# Patient Record
Sex: Female | Born: 1970 | Race: White | Hispanic: No | Marital: Married | Smoking: Never smoker
Health system: Southern US, Community
[De-identification: ages and names within clinical notes are randomized; demographics above are authoritative.]

## PROBLEM LIST (undated history)

## (undated) DIAGNOSIS — N200 Calculus of kidney: Secondary | ICD-10-CM

## (undated) DIAGNOSIS — I10 Essential (primary) hypertension: Secondary | ICD-10-CM

## (undated) HISTORY — PX: KNEE ARTHROSCOPY: SUR90

## (undated) HISTORY — PX: GALLBLADDER SURGERY: SHX652

## (undated) HISTORY — PX: ABDOMINAL HYSTERECTOMY: SHX81

---

## 2000-03-11 ENCOUNTER — Emergency Department (HOSPITAL_COMMUNITY): Admission: EM | Admit: 2000-03-11 | Discharge: 2000-03-11 | Payer: Self-pay | Admitting: Emergency Medicine

## 2000-03-11 ENCOUNTER — Encounter: Payer: Self-pay | Admitting: Emergency Medicine

## 2004-04-02 ENCOUNTER — Emergency Department: Payer: Self-pay | Admitting: Emergency Medicine

## 2007-01-11 ENCOUNTER — Observation Stay: Payer: Self-pay

## 2007-01-16 ENCOUNTER — Ambulatory Visit: Payer: Self-pay | Admitting: Obstetrics and Gynecology

## 2007-02-05 ENCOUNTER — Encounter: Payer: Self-pay | Admitting: Maternal & Fetal Medicine

## 2007-02-06 ENCOUNTER — Inpatient Hospital Stay: Payer: Self-pay | Admitting: Unknown Physician Specialty

## 2009-05-06 ENCOUNTER — Ambulatory Visit: Payer: Self-pay | Admitting: Family Medicine

## 2009-05-26 ENCOUNTER — Ambulatory Visit: Payer: Self-pay | Admitting: Family Medicine

## 2010-11-26 ENCOUNTER — Emergency Department: Payer: Self-pay

## 2011-01-15 ENCOUNTER — Emergency Department: Payer: Self-pay | Admitting: Emergency Medicine

## 2011-02-10 ENCOUNTER — Emergency Department: Payer: Self-pay | Admitting: Emergency Medicine

## 2012-01-19 ENCOUNTER — Emergency Department: Payer: Self-pay | Admitting: Emergency Medicine

## 2012-01-19 LAB — COMPREHENSIVE METABOLIC PANEL
Albumin: 4.1 g/dL (ref 3.4–5.0)
Alkaline Phosphatase: 88 U/L (ref 50–136)
BUN: 16 mg/dL (ref 7–18)
Bilirubin,Total: 0.3 mg/dL (ref 0.2–1.0)
Chloride: 108 mmol/L — ABNORMAL HIGH (ref 98–107)
Creatinine: 0.84 mg/dL (ref 0.60–1.30)
SGPT (ALT): 24 U/L (ref 12–78)
Total Protein: 8.1 g/dL (ref 6.4–8.2)

## 2012-01-19 LAB — URINALYSIS, COMPLETE
Bacteria: NONE SEEN
Glucose,UR: NEGATIVE mg/dL (ref 0–75)
Nitrite: NEGATIVE
Protein: 30

## 2012-01-19 LAB — CBC
HGB: 14.6 g/dL (ref 12.0–16.0)
MCH: 31.2 pg (ref 26.0–34.0)
RBC: 4.68 10*6/uL (ref 3.80–5.20)

## 2012-02-07 ENCOUNTER — Encounter (HOSPITAL_COMMUNITY): Payer: Self-pay | Admitting: Physical Medicine and Rehabilitation

## 2012-02-07 ENCOUNTER — Emergency Department (HOSPITAL_COMMUNITY)
Admission: EM | Admit: 2012-02-07 | Discharge: 2012-02-07 | Disposition: A | Discharge status: Home / Self Care | Payer: Self-pay | Attending: Emergency Medicine | Admitting: Emergency Medicine

## 2012-02-07 ENCOUNTER — Observation Stay: Payer: Self-pay | Admitting: Internal Medicine

## 2012-02-07 DIAGNOSIS — R109 Unspecified abdominal pain: Secondary | ICD-10-CM | POA: Insufficient documentation

## 2012-02-07 DIAGNOSIS — R112 Nausea with vomiting, unspecified: Secondary | ICD-10-CM | POA: Insufficient documentation

## 2012-02-07 LAB — URINALYSIS, COMPLETE
Bacteria: NONE SEEN
Blood: NEGATIVE
Glucose,UR: NEGATIVE mg/dL (ref 0–75)
Leukocyte Esterase: NEGATIVE
Nitrite: NEGATIVE
Protein: NEGATIVE
Specific Gravity: 1.023 (ref 1.003–1.030)

## 2012-02-07 LAB — URINALYSIS, ROUTINE W REFLEX MICROSCOPIC
Bilirubin Urine: NEGATIVE
Glucose, UA: NEGATIVE mg/dL
Hgb urine dipstick: NEGATIVE
Ketones, ur: NEGATIVE mg/dL
Leukocytes, UA: NEGATIVE
Nitrite: NEGATIVE
Protein, ur: NEGATIVE mg/dL
Specific Gravity, Urine: 1.034 — ABNORMAL HIGH (ref 1.005–1.030)
Urobilinogen, UA: 0.2 mg/dL (ref 0.0–1.0)
pH: 6 (ref 5.0–8.0)

## 2012-02-07 LAB — COMPREHENSIVE METABOLIC PANEL
Albumin: 4.1 g/dL (ref 3.4–5.0)
Anion Gap: 7 (ref 7–16)
Calcium, Total: 8.8 mg/dL (ref 8.5–10.1)
Chloride: 106 mmol/L (ref 98–107)
Co2: 24 mmol/L (ref 21–32)
EGFR (African American): >60
EGFR (Non-African Amer.): >60
Glucose: 90 mg/dL (ref 65–99)
Osmolality: 276 (ref 275–301)
Potassium: 4.5 mmol/L (ref 3.5–5.1)
SGOT(AST): 25 U/L (ref 15–37)
Sodium: 137 mmol/L (ref 136–145)

## 2012-02-07 LAB — CBC
HGB: 14.3 g/dL (ref 12.0–16.0)
MCV: 91 fL (ref 80–100)
Platelet: 174 10*3/uL (ref 150–440)
RBC: 4.55 10*6/uL (ref 3.80–5.20)
WBC: 8 10*3/uL (ref 3.6–11.0)

## 2012-02-07 LAB — LIPASE, BLOOD: Lipase: 142 U/L (ref 73–393)

## 2012-02-07 NOTE — ED Notes (Signed)
Pt presents to department for evaluation of R sided abdominal pain. Also states N/V. Onset today. 9/10 pain at the time. Denies urinary symptoms. She is conscious alert and oriented x4.

## 2012-02-08 LAB — CBC WITH DIFFERENTIAL/PLATELET
Basophil #: 0 10*3/uL (ref 0.0–0.1)
Eosinophil %: 0.6 %
HCT: 40 % (ref 35.0–47.0)
HGB: 13.8 g/dL (ref 12.0–16.0)
Lymphocyte %: 23.9 %
MCV: 91 fL (ref 80–100)
Monocyte %: 9.9 %
Neutrophil #: 4 10*3/uL (ref 1.4–6.5)
Platelet: 205 10*3/uL (ref 150–440)
RDW: 13.3 % (ref 11.5–14.5)

## 2012-02-08 LAB — COMPREHENSIVE METABOLIC PANEL
Anion Gap: 5 — ABNORMAL LOW (ref 7–16)
BUN: 10 mg/dL (ref 7–18)
Bilirubin,Total: 0.3 mg/dL (ref 0.2–1.0)
Chloride: 104 mmol/L (ref 98–107)
Co2: 28 mmol/L (ref 21–32)
EGFR (African American): >60
EGFR (Non-African Amer.): >60
Potassium: 4.3 mmol/L (ref 3.5–5.1)
SGOT(AST): 26 U/L (ref 15–37)
SGPT (ALT): 31 U/L (ref 12–78)

## 2012-02-08 LAB — LIPID PANEL
HDL Cholesterol: 45 mg/dL (ref 40–60)
Ldl Cholesterol, Calc: 92 mg/dL (ref 0–100)
Triglycerides: 83 mg/dL (ref 0–200)
VLDL Cholesterol, Calc: 17 mg/dL (ref 5–40)

## 2012-07-05 ENCOUNTER — Emergency Department: Payer: Self-pay | Admitting: Emergency Medicine

## 2012-07-05 LAB — COMPREHENSIVE METABOLIC PANEL
Albumin: 3.7 g/dL (ref 3.4–5.0)
Anion Gap: 3 — ABNORMAL LOW (ref 7–16)
BUN: 14 mg/dL (ref 7–18)
Calcium, Total: 8.4 mg/dL — ABNORMAL LOW (ref 8.5–10.1)
Creatinine: 0.87 mg/dL (ref 0.60–1.30)
Osmolality: 277 (ref 275–301)
Potassium: 3.7 mmol/L (ref 3.5–5.1)
Total Protein: 7.9 g/dL (ref 6.4–8.2)

## 2012-07-05 LAB — CBC
HCT: 38.5 % (ref 35.0–47.0)
MCHC: 32.7 g/dL (ref 32.0–36.0)
Platelet: 238 10*3/uL (ref 150–440)

## 2012-07-05 LAB — URINALYSIS, COMPLETE
Nitrite: NEGATIVE
Protein: NEGATIVE
Specific Gravity: 1.019 (ref 1.003–1.030)

## 2013-02-11 ENCOUNTER — Inpatient Hospital Stay: Payer: Self-pay | Admitting: Surgery

## 2013-02-11 LAB — URINALYSIS, COMPLETE
Bacteria: NONE SEEN
Glucose,UR: NEGATIVE mg/dL (ref 0–75)
Ketone: NEGATIVE
Leukocyte Esterase: NEGATIVE
Nitrite: NEGATIVE
Protein: NEGATIVE
Specific Gravity: 1.016 (ref 1.003–1.030)

## 2013-02-11 LAB — COMPREHENSIVE METABOLIC PANEL
Albumin: 3.9 g/dL (ref 3.4–5.0)
Alkaline Phosphatase: 88 U/L (ref 50–136)
Calcium, Total: 9.2 mg/dL (ref 8.5–10.1)
Chloride: 103 mmol/L (ref 98–107)
Creatinine: 0.91 mg/dL (ref 0.60–1.30)
Glucose: 88 mg/dL (ref 65–99)
Osmolality: 274 (ref 275–301)
SGPT (ALT): 25 U/L (ref 12–78)
Sodium: 137 mmol/L (ref 136–145)
Total Protein: 8.1 g/dL (ref 6.4–8.2)

## 2013-02-11 LAB — CBC
HCT: 41.4 % (ref 35.0–47.0)
MCH: 30 pg (ref 26.0–34.0)
RBC: 4.81 10*6/uL (ref 3.80–5.20)
RDW: 13.1 % (ref 11.5–14.5)

## 2013-02-11 LAB — LIPASE, BLOOD: Lipase: 205 U/L (ref 73–393)

## 2013-02-14 LAB — CREATININE, SERUM
Creatinine: 0.92 mg/dL (ref 0.60–1.30)
EGFR (African American): >60

## 2013-09-08 ENCOUNTER — Emergency Department: Payer: Self-pay | Admitting: Emergency Medicine

## 2013-09-08 LAB — CBC WITH DIFFERENTIAL/PLATELET
BASOS ABS: 0 10*3/uL (ref 0.0–0.1)
BASOS PCT: 0.2 %
Eosinophil #: 0.2 10*3/uL (ref 0.0–0.7)
Eosinophil %: 1.4 %
HCT: 39.9 % (ref 35.0–47.0)
HGB: 13.2 g/dL (ref 12.0–16.0)
Lymphocyte #: 1.9 10*3/uL (ref 1.0–3.6)
Lymphocyte %: 16.1 %
MCH: 29.6 pg (ref 26.0–34.0)
MCHC: 33.1 g/dL (ref 32.0–36.0)
MCV: 90 fL (ref 80–100)
MONOS PCT: 7 %
Monocyte #: 0.8 x10 3/mm (ref 0.2–0.9)
NEUTROS ABS: 8.7 10*3/uL — AB (ref 1.4–6.5)
Neutrophil %: 75.3 %
PLATELETS: 273 10*3/uL (ref 150–440)
RBC: 4.46 10*6/uL (ref 3.80–5.20)
RDW: 13.9 % (ref 11.5–14.5)
WBC: 11.5 10*3/uL — ABNORMAL HIGH (ref 3.6–11.0)

## 2013-09-08 LAB — COMPREHENSIVE METABOLIC PANEL
ALK PHOS: 75 U/L
ANION GAP: 5 — AB (ref 7–16)
Albumin: 3.5 g/dL (ref 3.4–5.0)
BILIRUBIN TOTAL: 0.1 mg/dL — AB (ref 0.2–1.0)
BUN: 19 mg/dL — ABNORMAL HIGH (ref 7–18)
CALCIUM: 8.6 mg/dL (ref 8.5–10.1)
CHLORIDE: 105 mmol/L (ref 98–107)
CO2: 25 mmol/L (ref 21–32)
Creatinine: 0.98 mg/dL (ref 0.60–1.30)
EGFR (African American): >60
EGFR (Non-African Amer.): >60
Glucose: 96 mg/dL (ref 65–99)
OSMOLALITY: 272 (ref 275–301)
Potassium: 4 mmol/L (ref 3.5–5.1)
SGOT(AST): 28 U/L (ref 15–37)
SGPT (ALT): 20 U/L (ref 12–78)
Sodium: 135 mmol/L — ABNORMAL LOW (ref 136–145)
Total Protein: 7.8 g/dL (ref 6.4–8.2)

## 2013-09-08 LAB — URINALYSIS, COMPLETE
BILIRUBIN, UR: NEGATIVE
GLUCOSE, UR: NEGATIVE mg/dL (ref 0–75)
Ketone: NEGATIVE
Nitrite: NEGATIVE
PH: 5 (ref 4.5–8.0)
SPECIFIC GRAVITY: 1.019 (ref 1.003–1.030)
WBC UR: 1851 /HPF (ref 0–5)

## 2013-09-10 LAB — URINE CULTURE

## 2014-08-19 NOTE — Discharge Summary (Signed)
PATIENT NAME:  Paula Mccormick, Paula Mccormick MR#:  [id number] DATE OF BIRTH:  07/02/1970  DATE OF ADMISSION:  02/07/2012 DATE OF DISCHARGE:  02/08/2012  PRESENTING COMPLAINT: Abdominal pain on and off for two weeks, acute on chronic. The patient has had chronic abdominal pain for more than one year.   DISCHARGE DIAGNOSES:  1. 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 The patient had [facility] and here at [facility] since January of 2011, and essentially no abnormality has been found.  2. Mild gastritis/gastroesophageal reflux disease.  3. Anxiety.  4. Methicillin-resistant Staphylococcus aureus infection, completed Bactrim course on 02/08/2012.    CONSULTATION:  1. GI consultation with Dr. Niel HummerIftikhar.  2. Surgery consultation was made, however, the patient left prior to being evaluated by Surgery.   FOLLOWUP:  1. Follow up with Dr. Lemar LivingsByrnett on October 21st at 2:00 p.m.  2. Follow up with Dr. Niel HummerIftikhar, GI, in 1 to 2 weeks.    PROCEDURES: Upper GI endoscopy showed mild gastritis. Normal esophagus, normal examined duodenum.  DISCHARGE MEDICATIONS:  1. Bentyl 10 mg t.i.d.  2. Percocet 5/325, 1 to 2 t.i.d. p.r.n.  3. Wellbutrin XL 300 mg extended-release, 1 tablet daily.  4. Omeprazole 40 mg extended-release, 1 tablet daily.   LABORATORY, DIAGNOSTIC AND RADIOLOGICAL DATA: Hepatobiliary scan negative without any obstruction or acute cholecystitis. Gallbladder ejection fraction is 83%. CBC within normal limits. Comprehensive metabolic panel within normal limits. Lipid profile within normal limits. Ultrasound of the abdomen: Normal gallbladder. Urine pregnancy test negative. Urinalysis negative for urinary tract infection. Lipase is 142.  BRIEF SUMMARY OF HOSPITAL COURSE: [name] is a 44 year old Caucasian female who has history of depression/anxiety, recent MRSA skin infection on Bactrim, completed the course  of Bactrim, came in with persistent abdominal pain and nausea for two or three weeks. The patient was evaluated in the ER two weeks ago. CT of the abdomen was negative except left inferior pole nephrolithiasis. Ultrasound also showed a normal gallbladder. She was admitted with:   1. Acute on chronic right upper quadrant pain with nausea: The patient underwent ultrasound of the abdomen and HIDA scan on 02/08/2012 which were essentially negative for gallbladder disease. The patient has had three other abdominal ultrasounds and three different CTs in the past since January 2011 here at [facility] along with two HIDA scans which have been negative for gallbladder disease. The patient believes this is her gallbladder pain and was requesting if she could be seen by a surgeon and get the gallbladder removed. She was explained that it is not her gallbladder at this time. However, she was very frustrated at did get at times emotional and tearful and wanted surgical consultation. I did curbside Dr. Excell Seltzerooper from Surgery. He was going to come to see the patient however, she requested she be seen by dr byrnett . The patient was seen by Dr. Niel HummerIftikhar, who performed an upper gastrointestinal endoscopy that showed mild gastritis. The patient is already taking PPI at home twice a day. Dr. Niel HummerIftikhar also recommends trial of Bentyl for possible visceral hypersensitivity syndrome/irritable bowel syndrome and follow-up in 2 to 3 weeks. Since the husband and patient were very upset and frustrated with the patient's ongoing abdominal pain for more than a year and a half, and not able to get any exact answers, I did involve Patient Relations. They were later on agreeable to go home and follow up with Dr. Lemar LivingsByrnett and Dr. Niel HummerIftikhar as outpatient.  2. Depression: Wellbutrin was continued.  3. Recent MRSA skin infection: On Bactrim, completed the course.   The hospital stay otherwise remained stable.   TIME SPENT: 40 minutes.   ____________________________ Wylie Hail Allena Katz, MD sap:cbb D: 02/09/2012 13:41:20 ET T: 02/10/2012 11:14:40 ET JOB#: [id number] cc: Lurline Del, MD Earline Mayotte, MD Willow Ora MD ELECTRONICALLY SIGNED 02/10/2012 21:00

## 2014-08-19 NOTE — Consult Note (Signed)
Chief Complaint:   Subjective/Chief Complaint EGD showed gastritis. Bx done. HIDA normal.  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  Will start Bentyl 10 mg TID. Elavil will be another option but due to potential interaction with Buspar will hold off for now. Patient to follow up with GI in 2 weeks for further recommendations. Continue PPI as OP. Will treat for H. pylori if positive as OP once the report is available. Full liquid diet, advance as tolerated. Will sign off. Please call us if needed. Discussed with Dr. Allena KatzPatel.   Electronic Signatures: Lurline DelIftikhar, Neno Hohensee (MD)  (Signed 09-Oct-13 12:40)  Authored: Chief Complaint   Last Updated: 09-Oct-13 12:40 by Lurline DelIftikhar, Teryl Gubler (MD)

## 2014-08-19 NOTE — Consult Note (Signed)
Brief Consult Note: Diagnosis: Chronic abdominal pain.   Patient was seen by consultant.   Comments: Chronic right sided abdominal pain, ? functional.  Recommendations: HIDA in am. If negative will proceed with an EGD. Will follow.  Electronic Signatures: Lurline DelIftikhar, Neely Kammerer (MD)  (Signed 08-Oct-13 17:58)  Authored: Brief Consult Note   Last Updated: 08-Oct-13 17:58 by Lurline DelIftikhar, Jariana Shumard (MD)

## 2014-08-19 NOTE — Consult Note (Signed)
PATIENT NAME:  Paula Mccormick, Paula Mccormick DATE OF BIRTH:  05-27-1970  DATE OF CONSULTATION:  02/08/2012  REFERRING PHYSICIAN:  Hospitalist service  CONSULTING PHYSICIAN:  Lurline DelShaukat Maneh Sieben, MD  REASON FOR CONSULTATION: Abdominal pain.   HISTORY OF PRESENT ILLNESS: The patient is a 44 year old female with history of depression. The patient was admitted with abdominal pain. According to the Emergency Room physician, the patient has been to the Emergency Room a couple of times with right upper quadrant abdominal pain. CT scan in the past as well as ultrasound had been fairly negative. She was last discharged on Percocet but came back to the Emergency Room again yesterday with pain mostly in the right upper quadrant area with some radiation to the back. Apparently this pain has been going on for more than a year and she has had [facility] with an EGD and a colonoscopy and some other tests about a year ago and, according to her, those tests were quite unremarkable. She has tried over-the-counter medicines as well as some prescription PPIs without significant improvement. She denies any heartburn and her pain is mainly aggravated by greasy food.   PAST MEDICAL HISTORY:  1. Depression. 2. History of MRSA. She is on Bactrim.   ALLERGIES: None.   SOCIAL HISTORY: She does not smoke or drink.   PAST SURGICAL HISTORY: None.   FAMILY HISTORY: Celine Ahrunt has lung cancer according to the chart.   MEDICATIONS AT HOME:  1. Wellbutrin. 2. Bactrim.   REVIEW OF SYSTEMS: Grossly negative except for what is mentioned in the history of present illness.    PHYSICAL EXAMINATION:   GENERAL: Well built female, somewhat obese, does not appear to be in any acute distress.   VITAL SIGNS: She is afebrile. Heart rate is in 70's and 80's, respirations about 12 to 14, blood pressure 135/78.   SKIN: Unremarkable. No jaundice was noted.   NECK: Neck veins are flat.   LUNGS: Grossly clear to  auscultation bilaterally with fair air entry. No added sounds.   CARDIOVASCULAR: Regular rate and rhythm. No gallops or murmur.   ABDOMEN: Quite soft and benign. Some tenderness was noted in the right upper quadrant area. There is no hepatosplenomegaly. No Murphy's sign. No rebound.   NEUROLOGIC: Unremarkable. She is fully awake, alert, and oriented.   LABORATORY, DIAGNOSTIC, AND RADIOLOGICAL DATA: White cell count normal at 8, hemoglobin 14.3, hematocrit 41.5, platelet count 174. Pregnancy test is negative. Electrolytes and liver enzymes normal. Serum lipase normal at 142.   Ultrasound of the abdomen normal. HIDA scan was requested, normal, with an ejection fraction of 83%.   ASSESSMENT AND PLAN: The patient is with right-sided abdominal pain with radiation to the back. This has been going on for more than a year and patient has had [facility] which was unremarkable. 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 An upper GI endoscopy was suggested as the HIDA scan is negative and upper GI endoscopy unremarkable except for mild gastritis. Her symptoms appear to be quite consistent with visceral hypersensitivity syndrome. I would recommend starting her on Bentyl 10 mg t.i.d. Advance diet. The patient can follow-up with GI as outpatient. Amitriptyline would be another option, although due to its potential interaction with BuSpar I will hold off on amitriptyline at this point. Further recommendations on outpatient follow-up.   Plan has been discussed with Dr. Allena KatzPatel. Will sign off. Please do not hesitate to  call us if we can be of any further assistance.   ____________________________ Lurline Del, MD si:drc D: 02/08/2012 12:45:36 ET T: 02/08/2012 12:57:13 ET JOB#: [id number]  cc: Lurline Del, MD, <Dictator> Lurline Del MD ELECTRONICALLY SIGNED 02/21/2012 17:22

## 2014-08-19 NOTE — H&P (Signed)
PATIENT NAME:  Paula HawthorneKERNODLE, Tahja L MR#:  [id number] DATE OF BIRTH:  17-Mar-1971  DATE OF ADMISSION:  02/07/2012  PRIMARY DOCTOR: Ulanda EdisonEmma Williams, MD   ER PHYSICIAN: Dr. Governor Rooksebecca Lord    CHIEF COMPLAINT: Abdominal pain.   HISTORY OF PRESENT ILLNESS: The patient is a 44 year old female patient with history of recent MRSA in the buttocks on Bactrim, also history of depression who came in because of abdominal pain. She has been having abdominal pain for the past 2 to 3 weeks and she also was seen in the ER on September 19th and discharged home for the same reason. The patient had abdominal pain in the right upper quadrant and midepigastric region radiating to the back around 8 to 10 out of 10 in severity. The patient has been having this pain for 2 to 3 weeks. She tried ibuprofen, Tylenol, Pepto-Bismol, and over-the-counter Emetrol for nausea. Because of persistent pain which is worsening and she is unable to keep anything down, she came to the ER today. The patient was noticed to have some fever this morning. The patient's pain is aggravated by eating and also eating greasy food. It is really not relieved with any ibuprofen or Tylenol. The patient denies any burning in the stomach. The patient has no chest pain. No cough but pain is worsened even by deep breath. No diarrhea.   PAST MEDICAL HISTORY:  1. Depression.  2. Recent history of MRSA, on Bactrim.   ALLERGIES: No known allergies.   SOCIAL HISTORY: No smoking. No drinking. No drugs. Is self-employed.   PAST SURGICAL HISTORY: None.   FAMILY HISTORY: The patient's aunt has lung cancer.    MEDICATIONS:  1. Wellbutrin 300 mg p.o. daily.  2. Bactrim 1 tablet p.o. b.i.d. There is one more day of Bactrim remaining for her recent MRSA in the buttock.   REVIEW OF SYSTEMS: CONSTITUTIONAL: Feels pain in the right upper quadrant and feels like fever this morning. EYES: No blurred vision. ENT: No tinnitus. No ear pain. No epistaxis. No difficulty  swallowing. RESPIRATORY: No cough. No wheezing. CARDIOVASCULAR: No chest pain. No orthopnea. GI: Has nausea, vomiting, abdominal pain. GU: No dysuria. ENDOCRINE: No polyuria. No nocturia. HEMATOLOGIC: No anemia or easy bruising. INTEGUMENTARY: No skin rashes. MUSCULOSKELETAL: No joint pain. NEUROLOGIC: No numbness or weakness. PSYCH: No anxiety or insomnia.   PHYSICAL EXAMINATION:   VITAL SIGNS: Temperature 97.7, pulse 77, respirations 24, blood pressure 137/86, sats 98% on room air.   GENERAL: Alert, awake, and oriented.    HEENT: Head atraumatic, normocephalic.   EYES: Pupils equally reacting to light. Extraocular movements intact.    ENT: No tympanic membrane congestion. No turbinate hypertrophy. No oropharyngeal erythema.   NECK: Normal range of motion. No JVD. No carotid bruits.   CARDIOVASCULAR: S1, S2 regular. No murmurs.   RESPIRATORY: Clear to auscultation. No wheeze. No rales.   CARDIOVASCULAR: S1, S2 regular. No murmurs.   ABDOMEN: The patient does have right upper quadrant tenderness present, also midepigastric tenderness present.   MUSCULOSKELETAL: Strength 5 out of 5 in upper and lower extremities.   SKIN: No skin rashes.   LYMPH: No cervical lymphadenopathy.   NEUROLOGIC: Oriented to time, place, person. Cranial nerves II through XII intact. Deep tendon reflexes 2+ bilaterally.   PSYCH: Oriented to time, place, and person.   LABORATORY, DIAGNOSTIC AND RADIOLOGIC DATA: Abdominal ultrasound showed pancreas is obstructed by bowel gas. Gallbladder is normal. No evidence of Murphy's sign. CBD is about 3 mm in diameter. Portal vein  is patent. Normal gallbladder.   Electrolytes sodium 137, potassium 4.5, chloride 106, bicarb 24, BUN 20, creatinine 0.82, glucose 90. LFTs within normal limits. WBC 8, hemoglobin 14.3, hematocrit 41.5, platelets 174. Lipase 142 over clear. Urinalysis showed normal clear urine. Leukocyte esterase negative.   EKG showed normal sinus, 75 beats  per minute.  Abdominal CAT scan on September 19th showed no acute findings in the abdomen or pelvis. Left-sided nephrolithiasis without hydronephrosis. There is a 2.6 cm cyst in the right ovary.    ASSESSMENT AND PLAN:  1. The patient is a 44 year old female with abdominal pain and nausea more after eating greasy foods. Symptoms are concerning for gallbladder disease. The patient cannot have HIDA scan because she got morphine. She got Dilaudid at 3:30 so HIDA scan is scheduled for tomorrow and she cannot have any narcotics for six hours. We are going to keep her on observation because she is unable to tolerate any p.o. pain medicines and needing IV pain medicine, IV fluids, and IV nausea medicine. Continue that. Continue IV pain medicine and nausea medicine. Continue clear liquids. Use Toradol for pain tomorrow. Get a HIDA scan and see if she has any gallbladder dyskinesia.   2. Depression. Continue Wellbutrin.  3. Recent history of MRSA. She is still on Bactrim so will order the Bactrim again.  4. Painful inspiration. Evaluated for pulmonary emboli by CT chest.  5. Recent history of CAT scan done of the abdomen which showed only left side nephrolithiasis but her pain is on the right side and her kidney function is normal so we will not repeat another CAT scan today of the abdomen.   Discussed the plan with the patient and the patient's husband in detail.   TIME SPENT: About 60 minutes on history and physical   ____________________________ Katha Hamming, MD sk:drc D: 02/07/2012 16:59:27 ET T: 02/07/2012 17:42:36 ET JOB#: [id number]  cc: Katha Hamming, MD, <Dictator> Lucillie Garfinkel. Mayford Knife, MD Katha Hamming MD ELECTRONICALLY SIGNED 03/01/2012 8:37

## 2014-08-22 NOTE — Consult Note (Signed)
Brief Consult Note: Diagnosis: RUQ pain.   Patient was seen by consultant.   Comments: Patient seen and evaluated. Episodic RUQ pain, with nasuea and vomiting, intermittently over the past 3 years. Worse over past 2 days. Last EGD was October 2013 with Dr Skeet Simmeriftikar, notable for gastritis. She was started on Bentyl for possible IBS. She takes bentyl currently TID, and this helps the nasuea and vomiting, but this has NOT helped the abdominal pain according to the patient. No significant changes to her bowel habits, they are chronically loose. No black or blood. No fever or chills. No heartburn, indigestion, or dysphagia. US unrevealing. HIDA in 2013 was unrevealing.  Agree with IV PPI and antiemetics for now. Will discuss with Dr Shelle Ironein and awaiting surgical input as well. full consult being dictated. will follow.  Electronic Signatures: Brantley StageEarle, Swayzie Choate M (PA-C)  (Signed 14-Oct-14 13:23)  Authored: Brief Consult Note   Last Updated: 14-Oct-14 13:23 by Ashok CordiaEarle, Carmin Dibartolo M (PA-C)

## 2014-08-22 NOTE — Consult Note (Signed)
I have seen and examined Ms [name] and agree with Wilhelmenia BlaseKaryn Earle's a/p. RUQ pain has been a chronic problem for her and she also has chronic problems with n/v, diarrhea, generalized abd pain. RUQ pain is not associated with meals and can lost for long constant stretches at a time.   has had extensive w/u to date including [facility].   is not typical biliary pain but there could still be a component of functional sphincter of oddi pain.  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  The treatement for SOD 3 is generaly conservative includivg measures such as PPI, anti-spasmodics, calcium channel blockers, TCA, SSRI since SOD 3 does not usually respond to sphincterotomy.  will await surgical input.  If cholecystectomy is not planned, would recommend addition of calcium channel blocker such as nifedipine and SSRI.    Electronic Signatures: Dow Adolphein, Aloysious Vangieson (MD)  (Signed on 14-Oct-14 17:32)  Authored  Last Updated: 14-Oct-14 17:32 by Dow Adolphein, Wilbur [place] (MD)

## 2014-08-22 NOTE — Consult Note (Signed)
PATIENT NAME:  Paula Mccormick, Paula L MR#:  [id number] DATE OF BIRTH:  12/28/1970  DATE OF CONSULTATION:  02/12/2013  REFERRING PHYSICIAN:  Ida Roguehristopher Lundquist, MD. CONSULTING PROVIDERS:  Hardie ShackletonKaryn M. Ernestine ConradEarle, PA-C, and Dow AdolphMatthew Rein, MD.  REASON FOR CONSULTATION: Right upper quadrant abdominal pain.   HISTORY OF PRESENT ILLNESS: This is a pleasant 44 year old female who presents with acute-on-chronic exacerbation of right upper quadrant abdominal pain, nausea and vomiting. She states that symptoms began about three years ago, but they do seem to return in an episodic fashion. When she is experiencing the discomfort, she admits that it can last for several days.   When she first presented to the hospital, she did undergo an abdominal ultrasound showing a partially contracted gallbladder with gallbladder-wall thickening of 4.5 mm but was negative for pericholecystic fluid, dilated common bile duct, and negative for gallstones. White blood cells were normal as was her hemoglobin.   Vital signs are stable and she is afebrile. LFTs within normal limits, and her lipase is within normal limits as well. She was started on promethazine IV as well as IV PPI 40 mg once a day, but the abdominal pain does still persist. The nausea and vomiting have resolved.   Work-up about one year ago in October 2013 did include an ultrasound, HIDA scan, CT scan, and an EGD. The only remarkable finding was some gastritis noted on the EGD by Dr. Niel HummerIftikhar on February 08, 2012. HIDA scan had an ejection fraction of 83% and it appeared that the gallbladder was normal.   She was started on Bentyl therapy 3 times a day by Dr. Niel HummerIftikhar last year. She continues to take the Bentyl which she states does help to significantly improve her nausea and vomiting but does not seem to affect her right upper quadrant abdominal pain whatsoever. Nausea has resolved since admission but the pain persists. The pain is postprandial and worse with greasy, fatty  foods. No significant changes to her bowel habits, though she does have a history of chronic diarrhea. No black or bloody stools. No fever or chills. No chest pain or shortness of breath. No recent travel or sick contacts.   ALLERGIES: No known drug allergies.   PAST MEDICAL HISTORY: MRSA and depression with history of gastritis and chronic right upper quadrant abdominal pain.   HOME MEDICATIONS: Wellbutrin, Bentyl and lisinopril.   SOCIAL HISTORY: The patient does report occasional social alcohol use but denies anything in excess. No evidence of tobacco or illicit drug use.   PAST SURGICAL HISTORY: Knee surgery.   FAMILY HISTORY: There is no known family history of GI malignancy, colon polyps or IBD.   REVIEW OF SYSTEMS: A 10-system review of systems was obtained on the patient. Pertinent positives are mentioned above and otherwise negative.   OBJECTIVE:  VITAL SIGNS: Blood pressure 144/89, pulse 82, respirations 17, temperature 98.8, bedside pulse oximetry 98%.  GENERAL: A pleasant 44 year old female resting quietly and comfortably in bed in no acute distress. Alert and oriented x3.  HEAD: Atraumatic, normocephalic.  NECK: Supple. No lymphadenopathy noted.  HEENT: Sclerae anicteric. Mucous membranes moist.  PULMONARY: Respirations are even and unlabored. Clear to auscultation bilateral anterior lung fields.  HEART: Regular rate and rhythm. S1, S2 noted.  ABDOMEN: Soft, nondistended. Mild tenderness to palpation is noted in the right upper quadrant but negative Murphy sign. No signs of an acute abdomen. Normoactive bowel sounds noted in all four quadrants.  RECTAL: Deferred.  PSYCHIATRIC: Appropriate mood and affect.  EXTREMITIES: Negative for  lower extremity edema, 2+ pulses noted bilaterally.   LABORATORY DATA: White blood cells 8.5, hemoglobin 14.4, hematocrit 41.4, platelets 254, sodium 137, potassium 3.9, BUN 15, creatinine 0.91, glucose 88. LFTs are within normal limits. Lipase  205.   IMAGING: An ultrasound of the abdomen was obtained on the patient showing a partially contracted gallbladder with gallbladder wall thickness of 4.5 mm. No evidence of cholecystic fluid, stones or common bile duct dilation.   Of note, in October 2013, she had a HIDA scan with an ejection fraction of 83%. She also had negative CT. EGD by Dr. Niel Hummer revealed gastritis in 2013.   ASSESSMENT:  1.  Acute-on-chronic right upper quadrant abdominal pain.  2.  Acute-on-chronic nausea and vomiting, worse with greasy food and postprandially.   PLAN: I have discussed this patient's case in detail with Dr. Dow Adolph who is involved in the development of the patient's plan of care. We did spend quite a bit of time reviewing her medical records including her negative HIDA scan last year and her most recent EGD by Dr. Niel Hummer on February 08, 2012, that was notable for gastritis.   The patient does respond to Bentyl in regards to her nausea and vomiting but states that the Bentyl does not improve her right upper quadrant abdominal pain whatsoever. She does have a history of gastritis and has not been taking a PPI; therefore, we do agree with initiating Protonix 40 mg IV daily.   We also recommend continuing Bentyl and antiemetics as needed. Continue symptomatic management as necessary. We will also check an H. pylori IgG level in the evaluation to see if her gastritis could have potentially progressed into a peptic ulcer disease. If she does not symptomatically improve with the above recommendations, we may or may not consider her for a repeat endoscopic intervention.   This was expressed to the patient who verbalized understanding, and all questions were answered.   Thank you so much for this consultation and for allowing [place] to participate in the patient's plan of care.   ATTENDING GASTROENTEROLOGIST: Dr. Shelle Iron.   These services provided by Brantley Stage, NP, under collaborative agreement with Dr.  Dow Adolph.    ____________________________ Hardie Shackleton. Paula Zinda, PA-C kme:np D: 02/12/2013 15:47:33 ET T: 02/12/2013 16:17:40 ET JOB#: [id number]  cc: Hardie Shackleton. Concepcion Gillott, PA-C, <Dictator> Hardie Shackleton Lagina Reader PA ELECTRONICALLY SIGNED 02/13/2013 12:19

## 2014-08-22 NOTE — H&P (Signed)
PATIENT NAME:  Paula Mccormick, Paula Mccormick MR#:  [id number] DATE OF BIRTH:  06-29-1970  DATE OF ADMISSION:  02/11/2013  REASON FOR ADMISSION: Recurrent right upper quadrant pain, nausea and vomiting.   HISTORY OF PRESENT ILLNESS: [name] is a pleasant 44 year old female with history of recurrent right upper quadrant pain that has been going on for years as well as nausea, vomiting. She says that over the last day it has gotten a lot worse. It usually begins with greasy foods and then she soon feels nausea and pain, but it also occurs intermittently. It has not improved since she has been in the ED. She has been on Bentyl in the past for dyspepsia and has found improvement. No fevers, chills, does have chronic diarrhea. No chest pain, shortness of breath, cough, headache, dysuria or hematuria.   PAST MEDICAL HISTORY: 1.  MRSA.  2.  Depression.  3.  Recurrent right upper quadrant pain.   HOME MEDICATIONS:  1.  Wellbutrin XL 300 daily.  2.  Bentyl 10 mg p.o. t.i.d.  3.  Lisinopril p.o. daily.   ALLERGIES: No known drug allergies.   SOCIAL HISTORY: Social alcohol, denies tobacco use.   FAMILY HISTORY: Aunt with lung cancer.    Also with medical history of knee surgery.   REVIEW OF SYSTEMS:  A 12-point review of system was obtained. Pertinent positives and negatives as above.   PHYSICAL EXAMINATION: VITAL SIGNS: Temperature 97.8, pulse 93, blood pressure 173/85, respirations 18, 99% on room air.  GENERAL: No acute distress. Alert and oriented x 3.  HEAD: Normocephalic, atraumatic.  EYES: No scleral icterus. No conjunctivitis.  CHEST: Lungs clear to auscultation. Moving air well.  HEART: Regular rate and rhythm. No murmurs, rubs or gallops.  ABDOMEN: Soft, nontender, nondistended.  EXTREMITIES: Moves extremities well. Strength 5/5.  NEUROLOGIC: Cranial nerves II through XII grossly intact.   LABORATORY DATA: Unremarkable. White cell count 8.5. Bilirubin is 0.2. Complete metabolic panel is  normal.   Ultrasound shows a partially contracted gallbladder. Gallbladder wall thickness is 4.5. No gallstones or sludge and no sonographic Murphy's. No pericholecystic fluid.   ASSESSMENT AND PLAN: [name] is a pleasant 44 year old female with history of recurrent right upper quadrant pain without gallstones or sludge. She has been scoped in the past which shows gastritis and was discharged on Bentyl, which appears to have worked. Symptoms biliary in nature; however, response to Bentyl is unusual. We will defer to Dr. Michela PitcherEly overall management of this patient's pain, but would consider a gastrointestinal consult for evaluation of non-biliary pain. I have spoken with [name] who agrees with the plan.   ____________________________ Si Raiderhristopher A. Malaysia Crance, MD cal:cc D: 02/11/2013 20:55:27 ET T: 02/11/2013 21:41:05 ET JOB#: [id number]  cc: Cristal Deerhristopher A. Anushka Hartinger, MD, <Dictator> Jarvis NewcomerHRISTOPHER A Oktober Glazer MD ELECTRONICALLY SIGNED 02/12/2013 23:29

## 2015-12-28 ENCOUNTER — Emergency Department: Payer: Medicaid Other

## 2015-12-28 ENCOUNTER — Encounter: Payer: Self-pay | Admitting: Emergency Medicine

## 2015-12-28 ENCOUNTER — Inpatient Hospital Stay
Admission: EM | Admit: 2015-12-28 | Discharge: 2015-12-30 | DRG: 313 | Disposition: A | Discharge status: Home / Self Care | Payer: Medicaid Other | Attending: Specialist | Admitting: Specialist

## 2015-12-28 DIAGNOSIS — Z8249 Family history of ischemic heart disease and other diseases of the circulatory system: Secondary | ICD-10-CM

## 2015-12-28 DIAGNOSIS — Z823 Family history of stroke: Secondary | ICD-10-CM

## 2015-12-28 DIAGNOSIS — F419 Anxiety disorder, unspecified: Secondary | ICD-10-CM | POA: Diagnosis present

## 2015-12-28 DIAGNOSIS — R0789 Other chest pain: Principal | ICD-10-CM | POA: Diagnosis present

## 2015-12-28 DIAGNOSIS — I517 Cardiomegaly: Secondary | ICD-10-CM | POA: Diagnosis present

## 2015-12-28 DIAGNOSIS — N179 Acute kidney failure, unspecified: Secondary | ICD-10-CM

## 2015-12-28 DIAGNOSIS — T502X5A Adverse effect of carbonic-anhydrase inhibitors, benzothiadiazides and other diuretics, initial encounter: Secondary | ICD-10-CM | POA: Diagnosis present

## 2015-12-28 DIAGNOSIS — E860 Dehydration: Secondary | ICD-10-CM

## 2015-12-28 DIAGNOSIS — I119 Hypertensive heart disease without heart failure: Secondary | ICD-10-CM | POA: Diagnosis present

## 2015-12-28 DIAGNOSIS — Z885 Allergy status to narcotic agent status: Secondary | ICD-10-CM

## 2015-12-28 DIAGNOSIS — I10 Essential (primary) hypertension: Secondary | ICD-10-CM

## 2015-12-28 DIAGNOSIS — Z841 Family history of disorders of kidney and ureter: Secondary | ICD-10-CM

## 2015-12-28 DIAGNOSIS — Z87442 Personal history of urinary calculi: Secondary | ICD-10-CM

## 2015-12-28 DIAGNOSIS — R0602 Shortness of breath: Secondary | ICD-10-CM

## 2015-12-28 DIAGNOSIS — R079 Chest pain, unspecified: Secondary | ICD-10-CM

## 2015-12-28 HISTORY — DX: Essential (primary) hypertension: I10

## 2015-12-28 HISTORY — DX: Calculus of kidney: N20.0

## 2015-12-28 NOTE — ED Triage Notes (Signed)
Pt arrived to the ED via [facility] from home for complaints of intermittent chest pain starting 1 day ago. Pt reports that today the pain worsen causing her to vomit and make her SOB. EMS gave 1 nitro spray bringing the chest pain from a 7 to a 3 on a 0-10 pain scale. Pt is AOx4 in no apparent distress. Pt states that she lost her sister to a "massive heart attack and fears that is what is happening right now."

## 2015-12-28 NOTE — ED Notes (Signed)
Pt went to CT

## 2015-12-29 ENCOUNTER — Inpatient Hospital Stay: Payer: Medicaid Other

## 2015-12-29 DIAGNOSIS — Z8249 Family history of ischemic heart disease and other diseases of the circulatory system: Secondary | ICD-10-CM | POA: Diagnosis not present

## 2015-12-29 DIAGNOSIS — I10 Essential (primary) hypertension: Secondary | ICD-10-CM | POA: Diagnosis present

## 2015-12-29 DIAGNOSIS — N179 Acute kidney failure, unspecified: Secondary | ICD-10-CM | POA: Diagnosis present

## 2015-12-29 DIAGNOSIS — I517 Cardiomegaly: Secondary | ICD-10-CM | POA: Diagnosis present

## 2015-12-29 DIAGNOSIS — Z841 Family history of disorders of kidney and ureter: Secondary | ICD-10-CM | POA: Diagnosis not present

## 2015-12-29 DIAGNOSIS — Z823 Family history of stroke: Secondary | ICD-10-CM | POA: Diagnosis not present

## 2015-12-29 DIAGNOSIS — R0602 Shortness of breath: Secondary | ICD-10-CM

## 2015-12-29 DIAGNOSIS — E860 Dehydration: Secondary | ICD-10-CM | POA: Diagnosis present

## 2015-12-29 DIAGNOSIS — R0789 Other chest pain: Secondary | ICD-10-CM | POA: Diagnosis present

## 2015-12-29 DIAGNOSIS — Z885 Allergy status to narcotic agent status: Secondary | ICD-10-CM | POA: Diagnosis not present

## 2015-12-29 DIAGNOSIS — I119 Hypertensive heart disease without heart failure: Secondary | ICD-10-CM | POA: Diagnosis present

## 2015-12-29 DIAGNOSIS — F419 Anxiety disorder, unspecified: Secondary | ICD-10-CM | POA: Diagnosis present

## 2015-12-29 DIAGNOSIS — T502X5A Adverse effect of carbonic-anhydrase inhibitors, benzothiadiazides and other diuretics, initial encounter: Secondary | ICD-10-CM | POA: Diagnosis present

## 2015-12-29 DIAGNOSIS — Z87442 Personal history of urinary calculi: Secondary | ICD-10-CM | POA: Diagnosis not present

## 2015-12-29 DIAGNOSIS — R079 Chest pain, unspecified: Secondary | ICD-10-CM | POA: Diagnosis present

## 2015-12-29 LAB — CBC
HEMATOCRIT: 48.5 % — AB (ref 35.0–47.0)
Hemoglobin: 16.3 g/dL — ABNORMAL HIGH (ref 12.0–16.0)
MCH: 29.3 pg (ref 26.0–34.0)
MCHC: 33.6 g/dL (ref 32.0–36.0)
MCV: 87.1 fL (ref 80.0–100.0)
Platelets: 322 10*3/uL (ref 150–440)
RBC: 5.57 MIL/uL — AB (ref 3.80–5.20)
RDW: 14.4 % (ref 11.5–14.5)
WBC: 11.6 10*3/uL — AB (ref 3.6–11.0)

## 2015-12-29 LAB — URINE DRUG SCREEN, QUALITATIVE (ARMC ONLY)
Amphetamines, Ur Screen: POSITIVE — AB
Barbiturates, Ur Screen: NOT DETECTED
Benzodiazepine, Ur Scrn: NOT DETECTED
Cannabinoid 50 Ng, Ur ~~LOC~~: NOT DETECTED
Cocaine Metabolite,Ur ~~LOC~~: NOT DETECTED
MDMA (Ecstasy)Ur Screen: NOT DETECTED
Methadone Scn, Ur: NOT DETECTED
Opiate, Ur Screen: POSITIVE — AB
Phencyclidine (PCP) Ur S: NOT DETECTED
Tricyclic, Ur Screen: NOT DETECTED

## 2015-12-29 LAB — COMPREHENSIVE METABOLIC PANEL
ALBUMIN: 3.9 g/dL (ref 3.5–5.0)
ALK PHOS: 65 U/L (ref 38–126)
ALT: 17 U/L (ref 14–54)
AST: 23 U/L (ref 15–41)
Anion gap: 10 (ref 5–15)
BILIRUBIN TOTAL: 0.4 mg/dL (ref 0.3–1.2)
BUN: 34 mg/dL — AB (ref 6–20)
CALCIUM: 8.8 mg/dL — AB (ref 8.9–10.3)
CO2: 22 mmol/L (ref 22–32)
Chloride: 104 mmol/L (ref 101–111)
Creatinine, Ser: 2.05 mg/dL — ABNORMAL HIGH (ref 0.44–1.00)
GFR calc Af Amer: 33 mL/min — ABNORMAL LOW (ref >60)
GFR, EST NON AFRICAN AMERICAN: 29 mL/min — AB (ref >60)
GLUCOSE: 104 mg/dL — AB (ref 65–99)
Potassium: 3.9 mmol/L (ref 3.5–5.1)
Sodium: 136 mmol/L (ref 135–145)
TOTAL PROTEIN: 7.2 g/dL (ref 6.5–8.1)

## 2015-12-29 LAB — BASIC METABOLIC PANEL
Anion gap: 6 (ref 5–15)
BUN: 33 mg/dL — AB (ref 6–20)
CO2: 23 mmol/L (ref 22–32)
CREATININE: 1.65 mg/dL — AB (ref 0.44–1.00)
Calcium: 8.1 mg/dL — ABNORMAL LOW (ref 8.9–10.3)
Chloride: 107 mmol/L (ref 101–111)
GFR calc Af Amer: 43 mL/min — ABNORMAL LOW (ref >60)
GFR, EST NON AFRICAN AMERICAN: 37 mL/min — AB (ref >60)
GLUCOSE: 95 mg/dL (ref 65–99)
POTASSIUM: 3.6 mmol/L (ref 3.5–5.1)
SODIUM: 136 mmol/L (ref 135–145)

## 2015-12-29 LAB — TROPONIN I
Troponin I: <0.03 ng/mL (ref <0.03)
Troponin I: <0.03 ng/mL (ref <0.03)
Troponin I: <0.03 ng/mL (ref <0.03)

## 2015-12-29 LAB — LIPID PANEL
Cholesterol: 122 mg/dL (ref 0–200)
HDL: 31 mg/dL — ABNORMAL LOW (ref >40)
LDL Cholesterol: 78 mg/dL (ref 0–99)
Total CHOL/HDL Ratio: 3.9 RATIO
Triglycerides: 65 mg/dL (ref <150)
VLDL: 13 mg/dL (ref 0–40)

## 2015-12-29 LAB — URINALYSIS COMPLETE WITH MICROSCOPIC (ARMC ONLY)
Bilirubin Urine: NEGATIVE
Glucose, UA: NEGATIVE mg/dL
Hgb urine dipstick: NEGATIVE
Ketones, ur: NEGATIVE mg/dL
Leukocytes, UA: NEGATIVE
Nitrite: NEGATIVE
Protein, ur: NEGATIVE mg/dL
RBC / HPF: NONE SEEN RBC/hpf (ref 0–5)
Specific Gravity, Urine: 1.016 (ref 1.005–1.030)
pH: 5 (ref 5.0–8.0)

## 2015-12-29 LAB — MRSA PCR SCREENING: MRSA by PCR: NEGATIVE

## 2015-12-29 LAB — TSH: TSH: 1.619 u[IU]/mL (ref 0.350–4.500)

## 2015-12-29 LAB — CK: Total CK: 54 U/L (ref 38–234)

## 2015-12-29 LAB — HEMOGLOBIN A1C: Hgb A1c MFr Bld: 5.1 % (ref 4.0–6.0)

## 2015-12-29 MED ORDER — LABETALOL HCL 5 MG/ML IV SOLN: 10.0000 mg | INTRAVENOUS | Status: DC | PRN

## 2015-12-29 MED ORDER — SODIUM CHLORIDE 0.9% FLUSH: 3.0000 mL | Freq: Two times a day (BID) | INTRAVENOUS | Status: DC

## 2015-12-29 MED ORDER — ONDANSETRON HCL 4 MG/2ML IJ SOLN
4.0000 mg | Freq: Four times a day (QID) | INTRAMUSCULAR | Status: DC | PRN
  Administered 2015-12-29: 4 mg via INTRAVENOUS
  Filled 2015-12-29 (×2): qty 2

## 2015-12-29 MED ORDER — MORPHINE SULFATE (PF) 2 MG/ML IV SOLN
2.0000 mg | INTRAVENOUS | Status: DC | PRN
  Administered 2015-12-29: 2 mg via INTRAVENOUS
  Filled 2015-12-29 (×2): qty 1

## 2015-12-29 MED ORDER — DIPHENHYDRAMINE HCL 25 MG PO CAPS
25.0000 mg | ORAL_CAPSULE | Freq: Four times a day (QID) | ORAL | Status: DC | PRN
  Administered 2015-12-29 – 2015-12-30 (×3): 25 mg via ORAL
  Filled 2015-12-29 (×3): qty 1

## 2015-12-29 MED ORDER — MORPHINE SULFATE (PF) 2 MG/ML IV SOLN
2.0000 mg | INTRAVENOUS | Status: DC | PRN
  Administered 2015-12-29 – 2015-12-30 (×2): 2 mg via INTRAVENOUS
  Filled 2015-12-29 (×2): qty 1

## 2015-12-29 MED ORDER — ACETAMINOPHEN 325 MG PO TABS: 650.0000 mg | ORAL_TABLET | Freq: Four times a day (QID) | ORAL | Status: DC | PRN

## 2015-12-29 MED ORDER — OXYCODONE HCL 5 MG PO TABS
5.0000 mg | ORAL_TABLET | ORAL | Status: DC | PRN
  Administered 2015-12-29 – 2015-12-30 (×3): 5 mg via ORAL
  Filled 2015-12-29 (×4): qty 1

## 2015-12-29 MED ORDER — HEPARIN SODIUM (PORCINE) 5000 UNIT/ML IJ SOLN
5000.0000 [IU] | Freq: Three times a day (TID) | INTRAMUSCULAR | Status: DC
  Administered 2015-12-29 – 2015-12-30 (×4): 5000 [IU] via SUBCUTANEOUS
  Filled 2015-12-29 (×4): qty 1

## 2015-12-29 MED ORDER — SODIUM CHLORIDE 0.9 % IV SOLN
INTRAVENOUS | Status: DC
  Administered 2015-12-29 – 2015-12-30 (×4): via INTRAVENOUS

## 2015-12-29 MED ORDER — MELATONIN 5 MG PO TABS
10.0000 mg | ORAL_TABLET | Freq: Every day | ORAL | Status: DC
  Administered 2015-12-29: 10 mg via ORAL
  Filled 2015-12-29 (×3): qty 2

## 2015-12-29 MED ORDER — IBUPROFEN 800 MG PO TABS
800.0000 mg | ORAL_TABLET | Freq: Once | ORAL | Status: AC
  Administered 2015-12-29: 800 mg via ORAL

## 2015-12-29 MED ORDER — DOCUSATE SODIUM 100 MG PO CAPS
100.0000 mg | ORAL_CAPSULE | Freq: Two times a day (BID) | ORAL | Status: DC
  Administered 2015-12-29 (×3): 100 mg via ORAL
  Filled 2015-12-29 (×3): qty 1

## 2015-12-29 MED ORDER — ALUM & MAG HYDROXIDE-SIMETH 200-200-20 MG/5ML PO SUSP
30.0000 mL | Freq: Four times a day (QID) | ORAL | Status: DC | PRN
  Administered 2015-12-29: 30 mL via ORAL
  Filled 2015-12-29: qty 30

## 2015-12-29 MED ORDER — SODIUM CHLORIDE 0.9 % IV BOLUS (SEPSIS)
1000.0000 mL | Freq: Once | INTRAVENOUS | Status: AC
Start: 2015-12-29 — End: 2015-12-29
  Administered 2015-12-29: 1000 mL via INTRAVENOUS

## 2015-12-29 MED ORDER — ZOLPIDEM TARTRATE 5 MG PO TABS
5.0000 mg | ORAL_TABLET | Freq: Every evening | ORAL | Status: DC | PRN
  Administered 2015-12-29: 5 mg via ORAL
  Filled 2015-12-29: qty 1

## 2015-12-29 MED ORDER — MORPHINE SULFATE (PF) 2 MG/ML IV SOLN
2.0000 mg | Freq: Once | INTRAVENOUS | Status: AC
  Administered 2015-12-29: 2 mg via INTRAVENOUS
  Filled 2015-12-29: qty 1

## 2015-12-29 MED ORDER — ASPIRIN EC 81 MG PO TBEC
81.0000 mg | DELAYED_RELEASE_TABLET | Freq: Every day | ORAL | Status: DC
  Filled 2015-12-29: qty 1

## 2015-12-29 MED ORDER — ONDANSETRON HCL 4 MG PO TABS
4.0000 mg | ORAL_TABLET | Freq: Four times a day (QID) | ORAL | Status: DC | PRN
  Administered 2015-12-29 – 2015-12-30 (×3): 4 mg via ORAL
  Filled 2015-12-29 (×3): qty 1

## 2015-12-29 MED ORDER — DIPHENHYDRAMINE HCL 50 MG/ML IJ SOLN
12.5000 mg | Freq: Once | INTRAMUSCULAR | Status: AC
  Administered 2015-12-29: 12.5 mg via INTRAVENOUS
  Filled 2015-12-29: qty 1

## 2015-12-29 MED ORDER — HYDROCHLOROTHIAZIDE 25 MG PO TABS
25.0000 mg | ORAL_TABLET | Freq: Every day | ORAL | Status: DC
  Filled 2015-12-29: qty 1

## 2015-12-29 MED ORDER — ONDANSETRON HCL 4 MG/2ML IJ SOLN
4.0000 mg | Freq: Once | INTRAMUSCULAR | Status: AC
  Administered 2015-12-29: 4 mg via INTRAVENOUS
  Filled 2015-12-29: qty 2

## 2015-12-29 MED ORDER — PANTOPRAZOLE SODIUM 40 MG PO TBEC
40.0000 mg | DELAYED_RELEASE_TABLET | Freq: Two times a day (BID) | ORAL | Status: DC
  Administered 2015-12-29 (×2): 40 mg via ORAL
  Filled 2015-12-29 (×2): qty 1

## 2015-12-29 MED ORDER — ACETAMINOPHEN 650 MG RE SUPP: 650.0000 mg | Freq: Four times a day (QID) | RECTAL | Status: DC | PRN

## 2015-12-29 MED ORDER — IBUPROFEN 800 MG PO TABS
ORAL_TABLET | ORAL | Status: AC
  Filled 2015-12-29: qty 1

## 2015-12-29 MED ORDER — DIPHENHYDRAMINE HCL 50 MG/ML IJ SOLN: 12.5000 mg | Freq: Four times a day (QID) | INTRAMUSCULAR | Status: DC | PRN

## 2015-12-29 MED ORDER — MORPHINE SULFATE (PF) 2 MG/ML IV SOLN
2.0000 mg | Freq: Once | INTRAVENOUS | Status: AC
Start: 2015-12-29 — End: 2015-12-29
  Administered 2015-12-29: 2 mg via INTRAVENOUS
  Filled 2015-12-29: qty 1

## 2015-12-29 NOTE — Care Management (Signed)
Patient  presented with ongoing chest pain and has ruled out for cardiac event.  Was found to have elevated bun and that creatinine has doubled.  She is [facility] but has not been seen in a year because she does not like the new provider.  Prov[facility].  She is currently without insurance.  Independent in all adls, and no issues with transportation.  Provided patient with [facility] generic list and [facility] and [facility] applications.  Will watch for discharge meds and affordability at discharge.  Anticipate discharge within the next 24 hours

## 2015-12-29 NOTE — Consult Note (Signed)
Cardiology Consultation Note  Patient ID: Paula Mccormick, MRN: [id number], DOB/AGE: [birth year] 45 y.o. Admit date: 12/28/2015   Date of Consult: 12/29/2015 Primary Physician: [facility] Primary Cardiologist: New to [facility] Requesting Physician: Dr. Cherlynn Kaiser, MD  Chief Complaint: Chest pain, nausea, LE cramping  Reason for Consult: Chest pain  HPI: 45 y.o. female with h/o HTN, kidney stones, and patient reported family history of premature CAD who presented to [facility] on 8/29 after having nonexertional chest pain, LE cramps, and a headache for 2 days.   No previously known cardiac history and she has never seen a cardiologist before. She was in her USOH until the evening of 8/27 when she developed nonexertional chest pain that radiated to the right arm. There was associated LE cramping, headache, and nausea. Pain improved and she went to bed. Upon waking on 8/28 she had some chest pain as above though it was improved. She reported just feeling tired all day on 8/28. She laid around for most of the day. The above symptoms persisted into the night on 8/28 prompting her to come in for evaluation. Never had symptoms like this before. She does report decreased PO fluid intake.   Upon the patient's arrival to [facility] they were found to have troponin negative x 3, BUN 34, SCr 2.05 (baseline 0.9), K+ 3.9, WBC 11.6, HGB 16.3, CK 54, TSH normal, urine drug screen positive for amphetamines, LDL 78. ECG as below CXR showed no active cardiopulmonary disease. She has received IV fluids overnight for hydration. Repeat bmet this morning is pending to evaluate for improved SCr to allow for CTA chest. Chest pain is reproducible to palpation on exam and currently with minimal pain. She denies tobacco abuse, etoh abuse, or illegal drug abuse.   She reports having a sister that has had negative blood work, though multiple MI's. She also reports this sister has had multiple stents and has been recommended to  have cardiac bypass surgery though per patient this had been placed on hold.   Past Medical History:  Diagnosis Date  . Essential hypertension   . Kidney stones       Most Recent Cardiac Studies: none   Surgical History:  Past Surgical History:  Procedure Laterality Date  . GALLBLADDER SURGERY    . KNEE ARTHROSCOPY       Home Meds: Prior to Admission medications   Medication Sig Start Date End Date Taking? Authorizing Provider  lisinopril-hydrochlorothiazide (PRINZIDE,ZESTORETIC) 20-25 MG tablet Take 1 tablet by mouth daily. 11/29/15 11/28/16 Yes Historical Provider, MD    Inpatient Medications:  . aspirin EC  81 mg Oral Daily  . docusate sodium  100 mg Oral BID  . heparin  5,000 Units Subcutaneous Q8H  . Melatonin  10 mg Oral QHS  . pantoprazole  40 mg Oral BID  . sodium chloride flush  3 mL Intravenous Q12H   . sodium chloride 125 mL/hr at 12/29/15 1016    Allergies:  Allergies  Allergen Reactions  . Morphine And Related Hives    Social History   Social History  . Marital status: Married    Spouse name: N/A  . Number of children: N/A  . Years of education: N/A   Occupational History  . Not on file.   Social History Main Topics  . Smoking status: Never Smoker  . Smokeless tobacco: Never Used  . Alcohol use No  . Drug use: No  . Sexual activity: Yes    Birth control/ protection: None  Other Topics Concern  . Not on file   Social History Narrative  . No narrative on file     Family History  Problem Relation Age of Onset  . Kidney failure Father     on dialysis  . Heart attack Sister   . Stroke Brother   . Chronic Renal Failure Mother      Review of Systems: Review of Systems  Constitutional: Positive for malaise/fatigue. Negative for chills, diaphoresis, fever and weight loss.  HENT: Negative for congestion.   Eyes: Negative for discharge and redness.  Respiratory: Negative for cough, hemoptysis, sputum production, shortness of breath and  wheezing.   Cardiovascular: Positive for chest pain. Negative for palpitations, orthopnea, claudication, leg swelling and PND.  Gastrointestinal: Negative for abdominal pain, heartburn, nausea and vomiting.  Musculoskeletal: Negative for falls and myalgias.  Skin: Negative for rash.  Neurological: Positive for weakness. Negative for dizziness, tingling, tremors, sensory change, speech change, focal weakness and loss of consciousness.  Endo/Heme/Allergies: Does not bruise/bleed easily.  Psychiatric/Behavioral: Negative for substance abuse. The patient is nervous/anxious.   All other systems reviewed and are negative.   Labs:  Recent Labs  12/28/15 2347 12/29/15 0340 12/29/15 0826  CKTOTAL  --  54  --   TROPONINI <0.03 <0.03 <0.03   Lab Results  Component Value Date   WBC 11.6 (H) 12/28/2015   HGB 16.3 (H) 12/28/2015   HCT 48.5 (H) 12/28/2015   MCV 87.1 12/28/2015   PLT 322 12/28/2015     Recent Labs Lab 12/28/15 2347 12/29/15 0826  NA 136 136  K 3.9 3.6  CL 104 107  CO2 22 23  BUN 34* 33*  CREATININE 2.05* 1.65*  CALCIUM 8.8* 8.1*  PROT 7.2  --   BILITOT 0.4  --   ALKPHOS 65  --   ALT 17  --   AST 23  --   GLUCOSE 104* 95   Lab Results  Component Value Date   CHOL 122 12/29/2015   HDL 31 (L) 12/29/2015   LDLCALC 78 12/29/2015   TRIG 65 12/29/2015   No results found for: DDIMER  Radiology/Studies:  Dg Chest 2 View  Result Date: 12/29/2015 IMPRESSION: No active cardiopulmonary disease. Electronically Signed   By: Ellery Plunk M.D.   On: 12/29/2015 00:06    EKG: Interpreted by me showed: sinus tachycardia, 107 bpm, LVH, nonspecific st/t changes, early repolarization  Telemetry: Interpreted by me showed: NSR, 80's bpm  Weights: [employer]   12/28/15 2340 12/29/15 0412  Weight: 182 lb (82.6 kg) 181 lb 6.4 oz (82.3 kg)     Physical Exam: Blood pressure 111/65, pulse 67, temperature 97.5 F (36.4 C), temperature source Oral, resp. rate 18,  height 5\' 3"  (1.6 m), weight 181 lb 6.4 oz (82.3 kg), last menstrual period 12/25/2015, SpO2 100 %. Body mass index is 32.13 kg/m. General: Well developed, well nourished, in no acute distress. Head: Normocephalic, atraumatic, sclera non-icteric, no xanthomas, nares are without discharge.  Neck: Negative for carotid bruits. JVD not elevated. Lungs: Clear bilaterally to auscultation without wheezes, rales, or rhonchi. Breathing is unlabored. Heart: RRR with S1 S2. No murmurs, rubs, or gallops appreciated. Chest pain reproducible to palpation on exam.  Abdomen: Obese, soft, non-tender, non-distended with normoactive bowel sounds. No hepatomegaly. No rebound/guarding. No obvious abdominal masses. Msk:  Strength and tone appear normal for age. Extremities: No clubbing or cyanosis. No edema. Distal pedal pulses are 2+ and equal bilaterally. Neuro: Alert and oriented X 3.  No facial asymmetry. No focal deficit. Moves all extremities spontaneously. Psych:  Responds to questions appropriately with a normal affect.    Assessment and Plan:  Active Problems:   Atypical chest pain   AKI (acute kidney injury) (HCC)   Essential hypertension    1. Atypical chest pain: -Ruled out -Await recheck bmet this morning to evaluate for improved renal function. If renal allows plan is to perform CTA chest to evaluate for PE and coronary calcifications. If this is unremarkable likely no plans for further inpatient ischemic evaluation -If renal function does not allow for CTA chest today would plan for nuclear stress testing on 8/30 -UDS positive for amphetamines  -Asprin   2. AKI: -S/p IV hydration overnight -BMET pending -Per IM  3. HTN: -Well controlled   Signed, Eula ListenRyan Merian Wroe, PA-C [facility] Pager: [phone] 12/29/2015, 10:27 AM

## 2015-12-29 NOTE — Progress Notes (Signed)
Pt is requesting to be discharge home, per pt "doctors are not communicating between them and they lie about orders" pt is also c/o pain of 9 and also states that "oxycodone don't do nothing for me" She's requesting Morphine, Benadryl, Ambien, and Zofran. MD Mariah MillingGollan is aware and medications will be administered per request.

## 2015-12-29 NOTE — ED Provider Notes (Signed)
[facility] Emergency Department Provider Note   ____________________________________________   First MD Initiated Contact with Patient 12/29/15 0025     (approximate)  I have reviewed the triage vital signs and the nursing notes.   HISTORY  Chief Complaint Chest Pain    HPI Paula Mccormick is a 45 y.o. female who presents to the ED from home via EMS with a chief complaint of chest pain. Patient reports waxing/waning chest pain which startedyesterday. Pain worsened in the evening and patient was unable to walk from the house to her car without being extremely short of breath. Symptoms associated with diaphoresis, shortness of breath, nausea/vomiting. Patient received aspirin and 1 nitroglycerin spray per EMS in route which partially alleviated her pain. Describes nonradiating central chest pressure associated with some left arm numbness. Denies recent fever, chills, abdominal pain, diarrhea. Denies recent travel or trauma.   Past Medical History:  Diagnosis Date  . Hypertension     There are no active problems to display for this patient.   Past Surgical History:  Procedure Laterality Date  . GALLBLADDER SURGERY      Prior to Admission medications   Medication Sig Start Date End Date Taking? Authorizing Provider  acetaminophen-codeine (TYLENOL #3) 300-30 MG per tablet Take 1 tablet by mouth every 4 (four) hours as needed. For pain    Historical Provider, MD  buPROPion (WELLBUTRIN XL) 150 MG 24 hr tablet Take 300 mg by mouth daily.    Historical Provider, MD  pantoprazole (PROTONIX) 40 MG tablet Take 40 mg by mouth daily.    Historical Provider, MD  ranitidine (ZANTAC) 150 MG tablet Take 150 mg by mouth 2 (two) times daily.    Historical Provider, MD  sulfamethoxazole-trimethoprim (BACTRIM DS) 800-160 MG per tablet Take 1 tablet by mouth 2 (two) times daily.    Historical Provider, MD    Allergies Review of patient's allergies indicates no  known allergies.  Family history Sister with CAD in her 37s Father with CAD Brother with CVA  Social History Social History  Substance Use Topics  . Smoking status: Never Smoker  . Smokeless tobacco: Never Used  . Alcohol use No    Review of Systems  Constitutional: No fever/chills. Eyes: No visual changes. ENT: No sore throat. Cardiovascular: Positive for chest pain. Respiratory: Positive for shortness of breath. Gastrointestinal: No abdominal pain.  Positive for nausea and vomiting.  No diarrhea.  No constipation. Genitourinary: Negative for dysuria. Musculoskeletal: Negative for back pain. Skin: Negative for rash. Neurological: Negative for headaches, focal weakness or numbness.  10-point ROS otherwise negative.  ____________________________________________   PHYSICAL EXAM:  VITAL SIGNS: ED Triage Vitals  Enc Vitals Group     BP 12/28/15 2339 117/77     Pulse Rate 12/28/15 2339 (!) 103     Resp 12/28/15 2339 18     Temp 12/28/15 2339 97.6 F (36.4 C)     Temp Source 12/28/15 2339 Oral     SpO2 12/28/15 2339 99 %     Weight 12/28/15 2340 182 lb (82.6 kg)     Height 12/28/15 2340 5\' 3"  (1.6 m)     Head Circumference --      Peak Flow --      Pain Score 12/28/15 2340 7     Pain Loc --      Pain Edu? --      Excl. in GC? --     Constitutional: Alert and oriented. Well appearing and in mild acute  distress. Mildly anxious. Eyes: Conjunctivae are normal. PERRL. EOMI. Head: Atraumatic. Nose: No congestion/rhinnorhea. Mouth/Throat: Mucous membranes are moist.  Oropharynx non-erythematous. Neck: No stridor.   Cardiovascular: Normal rate, regular rhythm. Grossly normal heart sounds.  Good peripheral circulation. Respiratory: Normal respiratory effort.  No retractions. Lungs CTAB. Gastrointestinal: Soft and nontender. No distention. No abdominal bruits. No CVA tenderness. Musculoskeletal: No lower extremity tenderness nor edema.  No joint effusions. Neurologic:   Normal speech and language. No gross focal neurologic deficits are appreciated. No gait instability. Skin:  Skin is warm, dry and intact. No rash noted. Psychiatric: Mood and affect are normal. Speech and behavior are normal.  ____________________________________________   LABS (all labs ordered are listed, but only abnormal results are displayed)  Labs Reviewed  CBC - Abnormal; Notable for the following:       Result Value   WBC 11.6 (*)    RBC 5.57 (*)    Hemoglobin 16.3 (*)    HCT 48.5 (*)    All other components within normal limits  COMPREHENSIVE METABOLIC PANEL - Abnormal; Notable for the following:    Glucose, Bld 104 (*)    BUN 34 (*)    Creatinine, Ser 2.05 (*)    Calcium 8.8 (*)    GFR calc non Af Amer 29 (*)    GFR calc Af Amer 33 (*)    All other components within normal limits  TROPONIN I  TROPONIN I  TROPONIN I  CK   ____________________________________________  EKG  ED ECG REPORT I, Cadynce Garrette J, the attending physician, personally viewed and interpreted this ECG.   Date: 12/29/2015  EKG Time: 2334  Rate: 107  Rhythm: sinus tachycardia  Axis: Normal  Intervals:none  ST&T Change: Nonspecific  ____________________________________________  RADIOLOGY  Chest 2 view (viewed by me, interpreted per Dr. Clovis RileyMitchell): No active cardiopulmonary disease. ____________________________________________   PROCEDURES  Procedure(s) performed: None  Procedures  Critical Care performed: No  ____________________________________________   INITIAL IMPRESSION / ASSESSMENT AND PLAN / ED COURSE  Pertinent labs & imaging results that were available during my care of the patient were reviewed by me and considered in my medical decision making (see chart for details).  45 year old female with a history of hypertension who presents with waxing/waning chest pain. She has a strong family history of CAD. Chest pain was partially relieved with nitroglycerin given per  EMS; patient now has headache. Reports rash with morphine and spouse states she can tolerate Dilaudid without difficulty. I explained to them that morphine is more appropriate for chest pain analgesia and will pair low-dose morphine with Benadryl.  Clinical Course  Comment By Time  Laboratory results notable for elevated renal function; prior labs for comparison 2 years ago normal. 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 Will discuss with hospitalist to evaluate patient in the emergency department for admission. Irean HongJade J Ashwin Tibbs, MD 08/29 0111     ____________________________________________   FINAL CLINICAL IMPRESSION(S) / ED DIAGNOSES  Final diagnoses:  Chest pain, unspecified chest pain type  Essential hypertension  Dehydration      NEW MEDICATIONS STARTED DURING THIS VISIT:  New Prescriptions   No medications on file     Note:  This document was prepared using Dragon voice recognition software and may include unintentional dictation errors.    Irean HongJade J Leanette Eutsler, MD 12/29/15 [phone]

## 2015-12-29 NOTE — H&P (Addendum)
Paula Mccormick is an 45 y.o. female.   Chief Complaint: Chest pain HPI: The patient with past medical history of high blood pressure presents to the emergency department complaining of chest pain that began more than 24 hours ago. The patient states that began while she was preparing for bed; was substernal and radiated down her right arm. She remembers feeling nauseous and her husband reports that she was clammy. She eventually fell asleep and awoke this morning with similar complaints in addition to abdominal pain and foot cramping. Now the patient states that she has a headache. She was given aspirin and sublingual nitroglycerin by EMS but she continues to have chest pain despite morphine. EKG shows normal sinus rhythm with LVH but no evidence of ischemia. Initial troponin is negative but renal function has declined from 2015 reference data. Due to ongoing chest pain and acute kidney injury the emergency department staff called for admission.  Past Medical History:  Diagnosis Date  . Hypertension   . Kidney stones     Past Surgical History:  Procedure Laterality Date  . GALLBLADDER SURGERY    . KNEE ARTHROSCOPY      Family History  Problem Relation Age of Onset  . Kidney failure Father     on dialysis  . Heart attack Sister   . Stroke Brother   . Chronic Renal Failure Mother    Social History:  reports that she has never smoked. She has never used smokeless tobacco. She reports that she does not drink alcohol or use drugs.  Allergies:  Allergies  Allergen Reactions  . Morphine And Related Hives    Medications Prior to Admission  Medication Sig Dispense Refill  . lisinopril-hydrochlorothiazide (PRINZIDE,ZESTORETIC) 20-25 MG tablet Take 1 tablet by mouth daily.      Results for orders placed or performed during the hospital encounter of 12/28/15 (from the past 48 hour(s))  CBC     Status: Abnormal   Collection Time: 12/28/15 11:47 PM  Result Value Ref Range   WBC 11.6 (H)  3.6 - 11.0 K/uL   RBC 5.57 (H) 3.80 - 5.20 MIL/uL   Hemoglobin 16.3 (H) 12.0 - 16.0 g/dL   HCT 48.5 (H) 35.0 - 47.0 %   MCV 87.1 80.0 - 100.0 fL   MCH 29.3 26.0 - 34.0 pg   MCHC 33.6 32.0 - 36.0 g/dL   RDW 14.4 11.5 - 14.5 %   Platelets 322 150 - 440 K/uL  Comprehensive metabolic panel     Status: Abnormal   Collection Time: 12/28/15 11:47 PM  Result Value Ref Range   Sodium 136 135 - 145 mmol/L   Potassium 3.9 3.5 - 5.1 mmol/L   Chloride 104 101 - 111 mmol/L   CO2 22 22 - 32 mmol/L   Glucose, Bld 104 (H) 65 - 99 mg/dL   BUN 34 (H) 6 - 20 mg/dL   Creatinine, Ser 2.05 (H) 0.44 - 1.00 mg/dL   Calcium 8.8 (L) 8.9 - 10.3 mg/dL   Total Protein 7.2 6.5 - 8.1 g/dL   Albumin 3.9 3.5 - 5.0 g/dL   AST 23 15 - 41 U/L   ALT 17 14 - 54 U/L   Alkaline Phosphatase 65 38 - 126 U/L   Total Bilirubin 0.4 0.3 - 1.2 mg/dL   GFR calc non Af Amer 29 (L) >60 mL/min   GFR calc Af Amer 33 (L) >60 mL/min    Comment: (NOTE) The eGFR has been calculated using the CKD EPI equation.  This calculation has not been validated in all clinical situations. eGFR's persistently <60 mL/min signify possible Chronic Kidney Disease.    Anion gap 10 5 - 15  Troponin I  (0, 3, 6)     Status: None   Collection Time: 12/28/15 11:47 PM  Result Value Ref Range   Troponin I <0.03 <0.03 ng/mL  MRSA PCR Screening     Status: None   Collection Time: 12/29/15  2:59 AM  Result Value Ref Range   MRSA by PCR NEGATIVE NEGATIVE    Comment:        The GeneXpert MRSA Assay (FDA approved for NASAL specimens only), is one component of a comprehensive MRSA colonization surveillance program. It is not intended to diagnose MRSA infection nor to guide or monitor treatment for MRSA infections.   Troponin I  (0, 3, 6)     Status: None   Collection Time: 12/29/15  3:40 AM  Result Value Ref Range   Troponin I <0.03 <0.03 ng/mL  CK     Status: None   Collection Time: 12/29/15  3:40 AM  Result Value Ref Range   Total CK 54 38 -  234 U/L  TSH     Status: None   Collection Time: 12/29/15  3:40 AM  Result Value Ref Range   TSH 1.619 0.350 - 4.500 uIU/mL  Lipid panel     Status: Abnormal   Collection Time: 12/29/15  3:40 AM  Result Value Ref Range   Cholesterol 122 0 - 200 mg/dL   Triglycerides 65 <150 mg/dL   HDL 31 (L) >40 mg/dL   Total CHOL/HDL Ratio 3.9 RATIO   VLDL 13 0 - 40 mg/dL   LDL Cholesterol 78 0 - 99 mg/dL    Comment:        Total Cholesterol/HDL:CHD Risk Coronary Heart Disease Risk Table                     Men   Women  1/2 Average Risk   3.4   3.3  Average Risk       5.0   4.4  2 X Average Risk   9.6   7.1  3 X Average Risk  23.4   11.0        Use the calculated Patient Ratio above and the CHD Risk Table to determine the patient's CHD Risk.        ATP III CLASSIFICATION (LDL):  <100     mg/dL   Optimal  100-129  mg/dL   Near or Above                    Optimal  130-159  mg/dL   Borderline  160-189  mg/dL   High  >190     mg/dL   Very High   Urinalysis complete, with microscopic ([facility] only)     Status: Abnormal   Collection Time: 12/29/15  3:44 AM  Result Value Ref Range   Color, Urine YELLOW (A) YELLOW   APPearance CLEAR (A) CLEAR   Glucose, UA NEGATIVE NEGATIVE mg/dL   Bilirubin Urine NEGATIVE NEGATIVE   Ketones, ur NEGATIVE NEGATIVE mg/dL   Specific Gravity, Urine 1.016 1.005 - 1.030   Hgb urine dipstick NEGATIVE NEGATIVE   pH 5.0 5.0 - 8.0   Protein, ur NEGATIVE NEGATIVE mg/dL   Nitrite NEGATIVE NEGATIVE   Leukocytes, UA NEGATIVE NEGATIVE   RBC / HPF NONE SEEN 0 - 5 RBC/hpf   WBC,  UA 0-5 0 - 5 WBC/hpf   Bacteria, UA RARE (A) NONE SEEN   Squamous Epithelial / LPF 0-5 (A) NONE SEEN   Mucous PRESENT    Hyaline Casts, UA PRESENT    Dg Chest 2 View  Result Date: 12/29/2015 CLINICAL DATA:  Intermittent chest pain of 1 day duration. EXAM: CHEST  2 VIEW COMPARISON:  None. FINDINGS: The lungs are clear. The pulmonary vasculature is normal. Heart size is normal. Hilar and  mediastinal contours are unremarkable. There is no pleural effusion. IMPRESSION: No active cardiopulmonary disease. Electronically Signed   By: Andreas Newport M.D.   On: 12/29/2015 00:06    Review of Systems  Constitutional: Positive for diaphoresis. Negative for chills and fever.  HENT: Negative for sore throat and tinnitus.   Eyes: Negative for blurred vision and redness.  Respiratory: Positive for shortness of breath. Negative for cough.   Cardiovascular: Positive for chest pain. Negative for palpitations, orthopnea and PND.  Gastrointestinal: Positive for abdominal pain and nausea. Negative for diarrhea and vomiting.  Genitourinary: Negative for dysuria, frequency and urgency.  Musculoskeletal: Positive for myalgias. Negative for joint pain.  Skin: Negative for rash.       No lesions  Neurological: Positive for headaches. Negative for speech change, focal weakness and weakness.  Endo/Heme/Allergies: Does not bruise/bleed easily.       No temperature intolerance  Psychiatric/Behavioral: Negative for depression and suicidal ideas.    Blood pressure 129/81, pulse 77, temperature 98.2 F (36.8 C), temperature source Oral, resp. rate 15, height _0  (1.6 m), weight 82.3 kg (181 lb 6.4 oz), last menstrual period 12/25/2015, SpO2 100 %. Physical Exam  Vitals reviewed. Constitutional: She is oriented to person, place, and time. She appears well-developed and well-nourished.  HENT:  Head: Normocephalic and atraumatic.  Mouth/Throat: Oropharynx is clear and moist.  Eyes: Conjunctivae and EOM are normal. Pupils are equal, round, and reactive to light. No scleral icterus.  Neck: Normal range of motion. Neck supple. No JVD present. No tracheal deviation present. No thyromegaly present.  Cardiovascular: Normal rate, regular rhythm and normal heart sounds.  Exam reveals no gallop and no friction rub.   No murmur heard. Respiratory: Effort normal and breath sounds normal.  GI: Soft. Bowel  sounds are normal. She exhibits no distension. There is no tenderness.  Genitourinary:  Genitourinary Comments: Deferred  Musculoskeletal: Normal range of motion. She exhibits no edema.  Lymphadenopathy:    She has no cervical adenopathy.  Neurological: She is alert and oriented to person, place, and time. No cranial nerve deficit. She exhibits normal muscle tone.  Skin: Skin is warm and dry. No rash noted. No erythema.  Psychiatric: She has a normal mood and affect. Her behavior is normal. Judgment and thought content normal.     Assessment/Plan This is a 46 year old female admitted for acute kidney injury. 1. Acute kidney injury: Unclear etiology. Baseline creatinine 0.98; presently creatinine has doubled. Avoid nephrotoxic agents. Hydrate with intravenous saline. Monitor urine output. The patient reports subjective decreased urination. History of kidney stones and renal failure within the family is concerning. Hyaline casts seen on urinalysis but no sediment or crystals reported. Scan for stones if urine output is verified low. Unlikely bilateral obstructive uropathy but possible given history of rapid onset renal failure and parents. Consult nephrology if no improvement after hydration. 2. Chest pain: Atypical. The patient's blood pressure is controlled here in the emergency department yet she has significant LVH by EKG. I'm concerned there may be  some drug abuse in her history although she denies this. Cardiac enzymes are reassuring but continued to follow biomarkers. Monitor telemetry. Consult cardiology at the discretion of the primary team. Note addendum: Patient found to have amphetamines in her urine. 3. Hypertension: Controlled. Continue hydrochlorothiazide. I have held lisinopril due to the patient's renal failure. 4. DVT prophylaxis: Heparin 5. GI prophylaxis: None The patient is a full code. Time spent on admission orders and patient care approximately 45 minutes  Harrie Foreman, MD 12/29/2015, 6:24 AM

## 2015-12-29 NOTE — Progress Notes (Signed)
Assessment & Plan  45 year old female with history of hypertension who presented to the hospital with chest pain.  1. Chest pain-very atypical. Likely anxiety mediated. As per the patient she has a strong family history. -Cardiac markers 3 have been negative, EKG shows no acute ST or T-wave changes. -appreicate Cards input and plan for nuclear stress test in a.m.  2. Acute kidney injury-secondary to dehydration. -Hold lisinopril HCTZ, continue IV fluids. Improving. - follow BUN/Cr in a.m.   3. HTN - BP stable.  - hold ACE, HCTZ due to renal failure.

## 2015-12-30 ENCOUNTER — Inpatient Hospital Stay (HOSPITAL_BASED_OUTPATIENT_CLINIC_OR_DEPARTMENT_OTHER): Payer: Medicaid Other

## 2015-12-30 ENCOUNTER — Inpatient Hospital Stay (HOSPITAL_COMMUNITY)
Admit: 2015-12-30 | Discharge: 2015-12-30 | Disposition: A | Discharge status: Home / Self Care | Payer: Medicaid Other | Attending: Cardiovascular Disease | Admitting: Cardiovascular Disease

## 2015-12-30 ENCOUNTER — Encounter: Payer: Self-pay | Admitting: Radiology

## 2015-12-30 DIAGNOSIS — R079 Chest pain, unspecified: Secondary | ICD-10-CM

## 2015-12-30 LAB — BASIC METABOLIC PANEL
ANION GAP: 3 — AB (ref 5–15)
BUN: 24 mg/dL — ABNORMAL HIGH (ref 6–20)
CALCIUM: 7.9 mg/dL — AB (ref 8.9–10.3)
CO2: 25 mmol/L (ref 22–32)
CREATININE: 1.19 mg/dL — AB (ref 0.44–1.00)
Chloride: 111 mmol/L (ref 101–111)
GFR, EST NON AFRICAN AMERICAN: 55 mL/min — AB (ref >60)
Glucose, Bld: 93 mg/dL (ref 65–99)
Potassium: 4 mmol/L (ref 3.5–5.1)
Sodium: 139 mmol/L (ref 135–145)

## 2015-12-30 LAB — NM MYOCAR MULTI W/SPECT W/WALL MOTION / EF
CHL CUP NUCLEAR SDS: 0
CHL CUP NUCLEAR SRS: 1
CHL CUP RESTING HR STRESS: 63 {beats}/min
LVDIAVOL: 109 mL (ref 46–106)
LVSYSVOL: 58 mL
NUC STRESS TID: 1.18
Peak HR: 116 {beats}/min
Percent HR: 65 %
SSS: 0

## 2015-12-30 LAB — ECHOCARDIOGRAM COMPLETE
HEIGHTINCHES: 63 in
Weight: 2934.76 oz

## 2015-12-30 MED ORDER — REGADENOSON 0.4 MG/5ML IV SOLN
0.4000 mg | Freq: Once | INTRAVENOUS | Status: AC
  Administered 2015-12-30: 0.4 mg via INTRAVENOUS
  Filled 2015-12-30: qty 5

## 2015-12-30 MED ORDER — TECHNETIUM TC 99M TETROFOSMIN IV KIT
30.0000 | PACK | Freq: Once | INTRAVENOUS | Status: AC | PRN
  Administered 2015-12-30: 32.06 via INTRAVENOUS

## 2015-12-30 MED ORDER — TECHNETIUM TC 99M TETROFOSMIN IV KIT
13.0000 | PACK | Freq: Once | INTRAVENOUS | Status: AC | PRN
  Administered 2015-12-30: 12.65 via INTRAVENOUS

## 2015-12-30 MED ORDER — OMEPRAZOLE 40 MG PO CPDR: 40.0000 mg | DELAYED_RELEASE_CAPSULE | Freq: Every day | ORAL | 0 refills | Status: DC

## 2015-12-30 MED ORDER — METOPROLOL SUCCINATE ER 25 MG PO TB24: 25.0000 mg | ORAL_TABLET | Freq: Every day | ORAL | 1 refills | Status: AC

## 2015-12-30 NOTE — Care Management (Signed)
patient to discharge on Prilosec and Lisinopril.  Will be able to pay for Lisinopril and provide with Good Rx Coupons for the generic Prilosec which will cost 7.00.  Is over the counter.

## 2015-12-30 NOTE — Progress Notes (Signed)
Notified by [employer] that pt had a 14 beat run of SVT, MD Anne HahnWillis made aware, per MD we will continue to monitor.

## 2015-12-30 NOTE — Progress Notes (Signed)
Patient: Paula Mccormick / Admit Date: 12/28/2015 / Date of Encounter: 12/30/2015, 7:49 AM   Subjective: Resting comfortably in bed this morning. Continued chest pain overnight requesting morphine. Has also received oxycodone and Ambien. Reports 8/10 chest pain early this morning that was improved to 6/10 s/p morphine at 0547. She is for nuclear stress test and echo this morning.  Review of Systems: Review of Systems  Constitutional: Positive for malaise/fatigue. Negative for chills, diaphoresis, fever and weight loss.  HENT: Negative for congestion.   Eyes: Negative for discharge and redness.  Respiratory: Negative for cough, sputum production, shortness of breath and wheezing.   Cardiovascular: Positive for chest pain. Negative for palpitations, orthopnea, claudication, leg swelling and PND.  Gastrointestinal: Negative for abdominal pain, heartburn, nausea and vomiting.  Musculoskeletal: Negative for falls and myalgias.  Skin: Negative for rash.  Neurological: Negative for dizziness, tingling, tremors, sensory change, speech change, focal weakness, loss of consciousness and weakness.  Endo/Heme/Allergies: Does not bruise/bleed easily.  Psychiatric/Behavioral: Positive for substance abuse. The patient is nervous/anxious.   All other systems reviewed and are negative.   Objective: Telemetry: NSR, 70's bpm Physical Exam: Blood pressure 108/63, pulse 72, temperature 98.5 F (36.9 C), temperature source Oral, resp. rate 16, height 5\' 3"  (1.6 m), weight 183 lb 6.8 oz (83.2 kg), last menstrual period 12/25/2015, SpO2 99 %. Body mass index is 32.49 kg/m. General: Well developed, well nourished, in no acute distress. Head: Normocephalic, atraumatic, sclera non-icteric, no xanthomas, nares are without discharge. Neck: Negative for carotid bruits. JVP not elevated. Lungs: Clear bilaterally to auscultation without wheezes, rales, or rhonchi. Breathing is unlabored. Heart: RRR S1 S2 without  murmurs, rubs, or gallops.  Abdomen: Obese, soft, non-tender, non-distended with normoactive bowel sounds. No rebound/guarding. Extremities: No clubbing or cyanosis. No edema. Distal pedal pulses are 2+ and equal bilaterally. Neuro: Alert and oriented X 3. Moves all extremities spontaneously. Psych:  Responds to questions appropriately with a normal affect.   Intake/Output Summary (Last 24 hours) at 12/30/15 0749 Last data filed at 12/30/15 0547  Gross per 24 hour  Intake          1981.67 ml  Output              700 ml  Net          1281.67 ml    Inpatient Medications:  . aspirin EC  81 mg Oral Daily  . docusate sodium  100 mg Oral BID  . heparin  5,000 Units Subcutaneous Q8H  . Melatonin  10 mg Oral QHS  . pantoprazole  40 mg Oral BID  . sodium chloride flush  3 mL Intravenous Q12H   Infusions:  . sodium chloride 75 mL/hr at 12/30/15 0547    Labs:  Recent Labs  12/29/15 0826 12/30/15 0306  NA 136 139  K 3.6 4.0  CL 107 111  CO2 23 25  GLUCOSE 95 93  BUN 33* 24*  CREATININE 1.65* 1.19*  CALCIUM 8.1* 7.9*    Recent Labs  12/28/15 2347  AST 23  ALT 17  ALKPHOS 65  BILITOT 0.4  PROT 7.2  ALBUMIN 3.9    Recent Labs  12/28/15 2347  WBC 11.6*  HGB 16.3*  HCT 48.5*  MCV 87.1  PLT 322    Recent Labs  12/28/15 2347 12/29/15 0340 12/29/15 0826  CKTOTAL  --  54  --   TROPONINI <0.03 <0.03 <0.03   Invalid input(s): POCBNP  Recent Labs  12/29/15 0340  HGBA1C 5.1     Weights: Filed Weights   12/28/15 2340 12/29/15 0412 12/30/15 0546  Weight: 182 lb (82.6 kg) 181 lb 6.4 oz (82.3 kg) 183 lb 6.8 oz (83.2 kg)     Radiology/Studies:  Dg Chest 2 View  Result Date: 12/29/2015 CLINICAL DATA:  Intermittent chest pain of 1 day duration. EXAM: CHEST  2 VIEW COMPARISON:  None. FINDINGS: The lungs are clear. The pulmonary vasculature is normal. Heart size is normal. Hilar and mediastinal contours are unremarkable. There is no pleural effusion.  IMPRESSION: No active cardiopulmonary disease. Electronically Signed   By: Ellery Plunkaniel R Mitchell M.D.   On: 12/29/2015 00:06   [place] Renal  Result Date: 12/29/2015 CLINICAL DATA:  Inpatient.  Acute renal failure. EXAM: RENAL / URINARY TRACT ULTRASOUND COMPLETE COMPARISON:  05/06/2009 abdominal sonogram and 01/19/2012 CT abdomen/pelvis. FINDINGS: Right Kidney: Length: 11.6 cm. Echogenicity within normal limits. No mass or hydronephrosis visualized. Left Kidney: Length: 11.0 cm. Echogenicity within normal limits. No mass or hydronephrosis visualized. Two nonobstructing 4 mm stones located in the lower and interpolar left kidney. Bladder: Relatively collapsed and grossly normal bladder. IMPRESSION: 1. No hydronephrosis . 2. Nonobstructing left renal stones . 3. Normal bladder. Electronically Signed   By: Delbert PhenixJason A Poff M.D.   On: 12/29/2015 11:06     Assessment and Plan  Active Problems:   Chest pain with moderate risk for cardiac etiology   AKI (acute kidney injury) (HCC)   Essential hypertension   Acute renal failure (HCC)   Dehydration   SOB (shortness of breath)    1. Chest pain with moderate risk of cardiac etiology: -Requesting frequent IV narcotics, with threatening to leave AMA if she does not receive them -Never with any exertional symptoms, mostly atypical in presentation   -She is for nuclear stress test this morning (Lexiscan), unable to treadmill 2/2 fatigue -Check echo to evaluate RV size and function given reported intermittent SOB -UDS positive for amphetamines  -Aspirin  -Concern for alternative motives   2. AKI: -Improved -IV hydration  -Unable to perform CTA chest 2/2 #2 upon admission  3. HTN: -Well controlled  4. Substance abuse: -Needs outpatient follow up with PCP   Signed, Eula Listenyan Raistlin Gum, PA-C [facility] Pager: [phone] 12/30/2015, 7:49 AM

## 2015-12-30 NOTE — Discharge Summary (Signed)
[facility] [place] at [facility]   PATIENT NAME: Paula Mccormick    MR#:  [id number]  DATE OF BIRTH:  1971/02/06  DATE OF ADMISSION:  12/28/2015 ADMITTING PHYSICIAN: Arnaldo Natal, MD  DATE OF DISCHARGE: 12/30/2015  2:22 PM  PRIMARY CARE PHYSICIAN: [facility]    ADMISSION DIAGNOSIS:  Dehydration [E86.0] Essential hypertension [I10] Chest pain, unspecified chest pain type [R07.9]  DISCHARGE DIAGNOSIS:  Active Problems:   AKI (acute kidney injury) (HCC)   Chest pain with moderate risk for cardiac etiology   Essential hypertension   Acute renal failure (HCC)   Dehydration   SOB (shortness of breath)   SECONDARY DIAGNOSIS:   Past Medical History:  Diagnosis Date  . Essential hypertension   . Kidney stones     HOSPITAL COURSE:   45 year old female with history of hypertension who presented to the hospital with chest pain.  1. Chest pain-very atypical. This was anxiety mediated. As per the patient she has a strong family history. -Cardiac markers 3 were negative, EKG shows no acute ST or T-wave changes. - seen by Cardiology and had Nuclear medicine stress test which showed no evidence of ischemia or wall motion abnormalities. -She was empirically discharged on some omeprazole and follow up with her primary care physician as outpatient.  2. Acute kidney injury-secondary to dehydration. -Patient was given some IV fluids and her lisinopril HCTZ was discontinued. -Her creatinine has improved.  3. HTN - she was taken off lisinopril HCTZ and discharged on oral Toprol.  DISCHARGE CONDITIONS:   Stable  CONSULTS OBTAINED:  Treatment Team:  Antonieta Iba, MD  DRUG ALLERGIES:   Allergies  Allergen Reactions  . Morphine And Related Hives    DISCHARGE MEDICATIONS:     Medication List    STOP taking these medications   lisinopril-hydrochlorothiazide 20-25 MG tablet Commonly known as:  PRINZIDE,ZESTORETIC      TAKE these medications   metoprolol succinate 25 MG 24 hr tablet Commonly known as:  TOPROL-XL Take 1 tablet (25 mg total) by mouth daily.   omeprazole 40 MG capsule Commonly known as:  PRILOSEC Take 1 capsule (40 mg total) by mouth daily.         DISCHARGE INSTRUCTIONS:   DIET:  Cardiac diet  DISCHARGE CONDITION:  Stable  ACTIVITY:  Activity as tolerated  OXYGEN:  Home Oxygen: No.   Oxygen Delivery: room air  DISCHARGE LOCATION:  home   If you experience worsening of your admission symptoms, develop shortness of breath, life threatening emergency, suicidal or homicidal thoughts you must seek medical attention immediately by calling 911 or calling your MD immediately  if symptoms less severe.  You Must read complete instructions/literature along with all the possible adverse reactions/side effects for all the Medicines you take and that have been prescribed to you. Take any new Medicines after you have completely understood and accpet all the possible adverse reactions/side effects.   Please note  You were cared for by a hospitalist during your hospital stay. If you have any questions about your discharge medications or the care you received while you were in the hospital after you are discharged, you can call the unit and asked to speak with the hospitalist on call if the hospitalist that took care of you is not available. Once you are discharged, your primary care physician will handle any further medical issues. Please note that NO REFILLS for any discharge medications will be authorized once you are discharged, as  it is imperative that you return to your primary care physician (or establish a relationship with a primary care physician if you do not have one) for your aftercare needs so that they can reassess your need for medications and monitor your lab values.     Today   Still having some vague chest pain.  No N/V.  Family at bedside.   VITAL SIGNS:  Blood  pressure (!) 149/91, pulse 71, temperature 97.9 F (36.6 C), temperature source Oral, resp. rate 20, height 5\' 3"  (1.6 m), weight 83.2 kg (183 lb 6.8 oz), last menstrual period 12/25/2015, SpO2 100 %.  I/O:   Intake/Output Summary (Last 24 hours) at 12/30/15 1519 Last data filed at 12/30/15 1413  Gross per 24 hour  Intake          1981.67 ml  Output              700 ml  Net          1281.67 ml    PHYSICAL EXAMINATION:  GENERAL:  45 y.o.-year-old patient lying in the bed with no acute distress.  EYES: Pupils equal, round, reactive to light and accommodation. No scleral icterus. Extraocular muscles intact.  HEENT: Head atraumatic, normocephalic. Oropharynx and nasopharynx clear.  NECK:  Supple, no jugular venous distention. No thyroid enlargement, no tenderness.  LUNGS: Normal breath sounds bilaterally, no wheezing, rales,rhonchi. No use of accessory muscles of respiration.  CARDIOVASCULAR: S1, S2 normal. No murmurs, rubs, or gallops.  ABDOMEN: Soft, non-tender, non-distended. Bowel sounds present. No organomegaly or mass.  EXTREMITIES: No pedal edema, cyanosis, or clubbing.  NEUROLOGIC: Cranial nerves II through XII are intact. No focal motor or sensory defecits b/l.  PSYCHIATRIC: The patient is alert and oriented x 3. Good affect.  SKIN: No obvious rash, lesion, or ulcer.   DATA REVIEW:   CBC  Recent Labs Lab 12/28/15 2347  WBC 11.6*  HGB 16.3*  HCT 48.5*  PLT 322    Chemistries   Recent Labs Lab 12/28/15 2347  12/30/15 0306  NA 136  < > 139  K 3.9  < > 4.0  CL 104  < > 111  CO2 22  < > 25  GLUCOSE 104*  < > 93  BUN 34*  < > 24*  CREATININE 2.05*  < > 1.19*  CALCIUM 8.8*  < > 7.9*  AST 23  --   --   ALT 17  --   --   ALKPHOS 65  --   --   BILITOT 0.4  --   --   < > = values in this interval not displayed.  Cardiac Enzymes  Recent Labs Lab 12/29/15 0826  TROPONINI <0.03    Microbiology Results  Results for orders placed or performed during the  hospital encounter of 12/28/15  MRSA PCR Screening     Status: None   Collection Time: 12/29/15  2:59 AM  Result Value Ref Range Status   MRSA by PCR NEGATIVE NEGATIVE Final    Comment:        The GeneXpert MRSA Assay (FDA approved for NASAL specimens only), is one component of a comprehensive MRSA colonization surveillance program. It is not intended to diagnose MRSA infection nor to guide or monitor treatment for MRSA infections.     RADIOLOGY:  Dg Chest 2 View  Result Date: 12/29/2015 CLINICAL DATA:  Intermittent chest pain of 1 day duration. EXAM: CHEST  2 VIEW COMPARISON:  None. FINDINGS: The lungs are clear.  The pulmonary vasculature is normal. Heart size is normal. Hilar and mediastinal contours are unremarkable. There is no pleural effusion. IMPRESSION: No active cardiopulmonary disease. Electronically Signed   By: Ellery Plunk M.D.   On: 12/29/2015 00:06   US Renal  Result Date: 12/29/2015 CLINICAL DATA:  Inpatient.  Acute renal failure. EXAM: RENAL / URINARY TRACT ULTRASOUND COMPLETE COMPARISON:  05/06/2009 abdominal sonogram and 01/19/2012 CT abdomen/pelvis. FINDINGS: Right Kidney: Length: 11.6 cm. Echogenicity within normal limits. No mass or hydronephrosis visualized. Left Kidney: Length: 11.0 cm. Echogenicity within normal limits. No mass or hydronephrosis visualized. Two nonobstructing 4 mm stones located in the lower and interpolar left kidney. Bladder: Relatively collapsed and grossly normal bladder. IMPRESSION: 1. No hydronephrosis . 2. Nonobstructing left renal stones . 3. Normal bladder. Electronically Signed   By: Delbert Phenix M.D.   On: 12/29/2015 11:06   Nm Myocar Multi W/spect W/wall Motion / Ef  Result Date: 12/30/2015 Pharmacological myocardial perfusion imaging study with no significant  ischemia Normal wall motion, EF estimated at 41% (depressed EF secondary to attenuation artifact) No EKG changes concerning for ischemia at peak stress or in recovery. Low  risk scan Echocardiogram confirmed normal EF, >55% Signed, Dossie Arbour, MD, Ph.D [facility]      Management plans discussed with the patient, family and they are in agreement.  CODE STATUS:     Code Status Orders        Start     Ordered   12/29/15 0249  Full code  Continuous     12/29/15 0248    Code Status History    Date Active Date Inactive Code Status Order ID Comments User Context   12/29/2015  2:48 AM 12/30/2015 10:25 AM Full Code [id number]  Arnaldo Natal, MD Inpatient      TOTAL TIME TAKING CARE OF THIS PATIENT: 40 minutes.    Houston Siren M.D on 12/30/2015 at 3:19 PM  Between 7am to 6pm - Pager - [phone]  After 6pm go to [email or web address] - Therapist, nutritional Hospitalists  Office  [phone]  CC: Primary care physician; [facility]

## 2015-12-30 NOTE — Progress Notes (Signed)
*  PRELIMINARY RESULTS* Echocardiogram 2D Echocardiogram has been performed.  Cristela BlueHege, Kenzey Birkland 12/30/2015, 10:57 AM

## 2015-12-30 NOTE — Progress Notes (Signed)
Spoke with dr. Mariah Millinggollan regarding stress test. Per md test was negative okay for discharge. Also spoke with dr. Cherlynn Kaisersainani to make aware of stress test results. Per md orders placed for discharge

## 2015-12-30 NOTE — Progress Notes (Signed)
Discharge instructions along home medication list and follow up gone over with patient and husband. Patient verbalized that she understood instructions. Printed prescription for prilosec and metoprolol given to patient. Iv and telemetry removed. Patient discharged home on ra no distress noted.

## 2017-04-12 ENCOUNTER — Other Ambulatory Visit: Payer: Self-pay | Admitting: Sports Medicine

## 2017-04-12 DIAGNOSIS — M25562 Pain in left knee: Secondary | ICD-10-CM

## 2017-05-11 DIAGNOSIS — J019 Acute sinusitis, unspecified: Secondary | ICD-10-CM | POA: Diagnosis not present

## 2017-11-13 DIAGNOSIS — Z1159 Encounter for screening for other viral diseases: Secondary | ICD-10-CM | POA: Diagnosis not present

## 2017-11-13 DIAGNOSIS — D509 Iron deficiency anemia, unspecified: Secondary | ICD-10-CM | POA: Diagnosis not present

## 2017-12-18 DIAGNOSIS — M222X2 Patellofemoral disorders, left knee: Secondary | ICD-10-CM | POA: Diagnosis not present

## 2017-12-18 DIAGNOSIS — G8929 Other chronic pain: Secondary | ICD-10-CM | POA: Diagnosis not present

## 2017-12-18 DIAGNOSIS — M25562 Pain in left knee: Secondary | ICD-10-CM | POA: Diagnosis not present

## 2017-12-22 ENCOUNTER — Other Ambulatory Visit: Payer: Self-pay | Admitting: Sports Medicine

## 2017-12-22 DIAGNOSIS — G8929 Other chronic pain: Secondary | ICD-10-CM

## 2017-12-22 DIAGNOSIS — M25562 Pain in left knee: Principal | ICD-10-CM

## 2017-12-29 DIAGNOSIS — Z1231 Encounter for screening mammogram for malignant neoplasm of breast: Secondary | ICD-10-CM | POA: Diagnosis not present

## 2017-12-29 DIAGNOSIS — R928 Other abnormal and inconclusive findings on diagnostic imaging of breast: Secondary | ICD-10-CM | POA: Diagnosis not present

## 2018-01-09 ENCOUNTER — Ambulatory Visit: Payer: Medicaid Other

## 2018-01-10 DIAGNOSIS — G894 Chronic pain syndrome: Secondary | ICD-10-CM | POA: Diagnosis not present

## 2018-01-10 DIAGNOSIS — F1111 Opioid abuse, in remission: Secondary | ICD-10-CM | POA: Diagnosis not present

## 2018-01-10 DIAGNOSIS — F321 Major depressive disorder, single episode, moderate: Secondary | ICD-10-CM | POA: Diagnosis not present

## 2018-01-10 DIAGNOSIS — I10 Essential (primary) hypertension: Secondary | ICD-10-CM | POA: Diagnosis not present

## 2018-01-10 DIAGNOSIS — M170 Bilateral primary osteoarthritis of knee: Secondary | ICD-10-CM | POA: Diagnosis not present

## 2018-01-10 DIAGNOSIS — B373 Candidiasis of vulva and vagina: Secondary | ICD-10-CM | POA: Diagnosis not present

## 2018-01-16 ENCOUNTER — Other Ambulatory Visit: Payer: Self-pay

## 2018-01-16 ENCOUNTER — Emergency Department: Payer: Medicaid Other

## 2018-01-16 DIAGNOSIS — Z5321 Procedure and treatment not carried out due to patient leaving prior to being seen by health care provider: Secondary | ICD-10-CM | POA: Diagnosis not present

## 2018-01-16 DIAGNOSIS — R079 Chest pain, unspecified: Secondary | ICD-10-CM | POA: Diagnosis not present

## 2018-01-16 LAB — COMPREHENSIVE METABOLIC PANEL
ALBUMIN: 4.1 g/dL (ref 3.5–5.0)
ALT: 16 U/L (ref 0–44)
AST: 19 U/L (ref 15–41)
Alkaline Phosphatase: 91 U/L (ref 38–126)
Anion gap: 6 (ref 5–15)
BILIRUBIN TOTAL: 0.7 mg/dL (ref 0.3–1.2)
BUN: 19 mg/dL (ref 6–20)
CO2: 24 mmol/L (ref 22–32)
CREATININE: 0.82 mg/dL (ref 0.44–1.00)
Calcium: 8.8 mg/dL — ABNORMAL LOW (ref 8.9–10.3)
Chloride: 108 mmol/L (ref 98–111)
GFR calc non Af Amer: >60 mL/min (ref >60)
Glucose, Bld: 97 mg/dL (ref 70–99)
Potassium: 3.9 mmol/L (ref 3.5–5.1)
Sodium: 138 mmol/L (ref 135–145)
Total Protein: 7.6 g/dL (ref 6.5–8.1)

## 2018-01-16 LAB — CBC
HEMATOCRIT: 42.5 % (ref 35.0–47.0)
HEMOGLOBIN: 14.9 g/dL (ref 12.0–16.0)
MCH: 32.2 pg (ref 26.0–34.0)
MCHC: 35 g/dL (ref 32.0–36.0)
MCV: 91.9 fL (ref 80.0–100.0)
PLATELETS: 298 10*3/uL (ref 150–440)
RBC: 4.62 MIL/uL (ref 3.80–5.20)
RDW: 12.1 % (ref 11.5–14.5)
WBC: 4.6 10*3/uL (ref 3.6–11.0)

## 2018-01-16 LAB — TROPONIN I

## 2018-01-16 NOTE — ED Triage Notes (Signed)
Pt in with co left sided chest pressure that radiates to left shoulder and back. Did have some nausea, no cardiac hx.

## 2018-01-17 ENCOUNTER — Emergency Department
Admission: EM | Admit: 2018-01-17 | Discharge: 2018-01-17 | Discharge status: Against Medical Advice | Payer: Medicaid Other | Attending: Emergency Medicine | Admitting: Emergency Medicine

## 2018-01-17 ENCOUNTER — Telehealth: Payer: Self-pay | Admitting: Emergency Medicine

## 2018-01-17 NOTE — Telephone Encounter (Addendum)
Called patient due to lwot to inquire about condition and follow up plans. Left message.   Patient called me back.  She has not called pcp, and has had chest pain since she left.  I explained that she really needs exam, as we cannot rule out things like heart attack and pulmonary embolism.  Says she is worried because her sister had heart attack at 5645.  I told her that despite the potential wait, she should be seen and can return here.  She says she will return if it comes back tonight and will call her doctor in am.

## 2018-01-24 DIAGNOSIS — R928 Other abnormal and inconclusive findings on diagnostic imaging of breast: Secondary | ICD-10-CM | POA: Diagnosis not present

## 2018-01-24 DIAGNOSIS — N6322 Unspecified lump in the left breast, upper inner quadrant: Secondary | ICD-10-CM | POA: Diagnosis not present

## 2018-01-24 DIAGNOSIS — N6324 Unspecified lump in the left breast, lower inner quadrant: Secondary | ICD-10-CM | POA: Diagnosis not present

## 2018-01-26 DIAGNOSIS — R928 Other abnormal and inconclusive findings on diagnostic imaging of breast: Secondary | ICD-10-CM | POA: Diagnosis not present

## 2018-02-05 DIAGNOSIS — G894 Chronic pain syndrome: Secondary | ICD-10-CM | POA: Diagnosis not present

## 2018-02-05 DIAGNOSIS — M25569 Pain in unspecified knee: Secondary | ICD-10-CM | POA: Diagnosis not present

## 2018-02-05 DIAGNOSIS — M170 Bilateral primary osteoarthritis of knee: Secondary | ICD-10-CM | POA: Diagnosis not present

## 2018-02-07 DIAGNOSIS — M170 Bilateral primary osteoarthritis of knee: Secondary | ICD-10-CM | POA: Diagnosis not present

## 2018-02-07 DIAGNOSIS — R928 Other abnormal and inconclusive findings on diagnostic imaging of breast: Secondary | ICD-10-CM | POA: Diagnosis not present

## 2018-02-07 DIAGNOSIS — M25562 Pain in left knee: Secondary | ICD-10-CM | POA: Diagnosis not present

## 2018-02-07 DIAGNOSIS — M25561 Pain in right knee: Secondary | ICD-10-CM | POA: Diagnosis not present

## 2018-02-07 DIAGNOSIS — M25462 Effusion, left knee: Secondary | ICD-10-CM | POA: Diagnosis not present

## 2018-02-07 DIAGNOSIS — M25461 Effusion, right knee: Secondary | ICD-10-CM | POA: Diagnosis not present

## 2018-02-09 DIAGNOSIS — F321 Major depressive disorder, single episode, moderate: Secondary | ICD-10-CM | POA: Diagnosis not present

## 2018-02-09 DIAGNOSIS — R87612 Low grade squamous intraepithelial lesion on cytologic smear of cervix (LGSIL): Secondary | ICD-10-CM | POA: Diagnosis not present

## 2018-02-09 DIAGNOSIS — Z124 Encounter for screening for malignant neoplasm of cervix: Secondary | ICD-10-CM | POA: Diagnosis not present

## 2018-02-09 DIAGNOSIS — G894 Chronic pain syndrome: Secondary | ICD-10-CM | POA: Diagnosis not present

## 2018-02-09 DIAGNOSIS — F1111 Opioid abuse, in remission: Secondary | ICD-10-CM | POA: Diagnosis not present

## 2018-02-09 DIAGNOSIS — Z87898 Personal history of other specified conditions: Secondary | ICD-10-CM | POA: Diagnosis not present

## 2018-02-12 DIAGNOSIS — S83241A Other tear of medial meniscus, current injury, right knee, initial encounter: Secondary | ICD-10-CM | POA: Diagnosis not present

## 2018-02-12 DIAGNOSIS — M25561 Pain in right knee: Secondary | ICD-10-CM | POA: Diagnosis not present

## 2018-02-12 DIAGNOSIS — N3946 Mixed incontinence: Secondary | ICD-10-CM | POA: Diagnosis not present

## 2018-02-12 DIAGNOSIS — S83231A Complex tear of medial meniscus, current injury, right knee, initial encounter: Secondary | ICD-10-CM | POA: Diagnosis not present

## 2018-02-12 DIAGNOSIS — M948X6 Other specified disorders of cartilage, lower leg: Secondary | ICD-10-CM | POA: Diagnosis not present

## 2018-02-19 DIAGNOSIS — M170 Bilateral primary osteoarthritis of knee: Secondary | ICD-10-CM | POA: Diagnosis not present

## 2018-03-08 DIAGNOSIS — G4700 Insomnia, unspecified: Secondary | ICD-10-CM | POA: Diagnosis not present

## 2018-03-08 DIAGNOSIS — R87619 Unspecified abnormal cytological findings in specimens from cervix uteri: Secondary | ICD-10-CM | POA: Diagnosis not present

## 2018-03-08 DIAGNOSIS — F1111 Opioid abuse, in remission: Secondary | ICD-10-CM | POA: Diagnosis not present

## 2018-03-08 DIAGNOSIS — Z124 Encounter for screening for malignant neoplasm of cervix: Secondary | ICD-10-CM | POA: Diagnosis not present

## 2018-03-08 DIAGNOSIS — G894 Chronic pain syndrome: Secondary | ICD-10-CM | POA: Diagnosis not present

## 2018-03-08 DIAGNOSIS — F321 Major depressive disorder, single episode, moderate: Secondary | ICD-10-CM | POA: Diagnosis not present

## 2018-03-08 DIAGNOSIS — Z87898 Personal history of other specified conditions: Secondary | ICD-10-CM | POA: Diagnosis not present

## 2018-03-08 DIAGNOSIS — I10 Essential (primary) hypertension: Secondary | ICD-10-CM | POA: Diagnosis not present

## 2018-05-14 DIAGNOSIS — N840 Polyp of corpus uteri: Secondary | ICD-10-CM | POA: Diagnosis not present

## 2018-05-14 DIAGNOSIS — R87612 Low grade squamous intraepithelial lesion on cytologic smear of cervix (LGSIL): Secondary | ICD-10-CM | POA: Diagnosis not present

## 2018-05-14 DIAGNOSIS — R87619 Unspecified abnormal cytological findings in specimens from cervix uteri: Secondary | ICD-10-CM | POA: Diagnosis not present

## 2018-05-14 DIAGNOSIS — N72 Inflammatory disease of cervix uteri: Secondary | ICD-10-CM | POA: Diagnosis not present

## 2018-05-14 DIAGNOSIS — N939 Abnormal uterine and vaginal bleeding, unspecified: Secondary | ICD-10-CM | POA: Diagnosis not present

## 2018-06-05 DIAGNOSIS — R87612 Low grade squamous intraepithelial lesion on cytologic smear of cervix (LGSIL): Secondary | ICD-10-CM | POA: Diagnosis not present

## 2018-06-05 DIAGNOSIS — N939 Abnormal uterine and vaginal bleeding, unspecified: Secondary | ICD-10-CM | POA: Diagnosis not present

## 2018-06-05 DIAGNOSIS — R87619 Unspecified abnormal cytological findings in specimens from cervix uteri: Secondary | ICD-10-CM | POA: Diagnosis not present

## 2018-06-07 DIAGNOSIS — Z79899 Other long term (current) drug therapy: Secondary | ICD-10-CM | POA: Diagnosis not present

## 2018-06-07 DIAGNOSIS — G894 Chronic pain syndrome: Secondary | ICD-10-CM | POA: Diagnosis not present

## 2018-06-07 DIAGNOSIS — F321 Major depressive disorder, single episode, moderate: Secondary | ICD-10-CM | POA: Diagnosis not present

## 2018-06-07 DIAGNOSIS — G2581 Restless legs syndrome: Secondary | ICD-10-CM | POA: Diagnosis not present

## 2018-06-07 DIAGNOSIS — I10 Essential (primary) hypertension: Secondary | ICD-10-CM | POA: Diagnosis not present

## 2018-06-07 DIAGNOSIS — Z9181 History of falling: Secondary | ICD-10-CM | POA: Diagnosis not present

## 2018-06-07 DIAGNOSIS — F1111 Opioid abuse, in remission: Secondary | ICD-10-CM | POA: Diagnosis not present

## 2018-06-07 DIAGNOSIS — R251 Tremor, unspecified: Secondary | ICD-10-CM | POA: Diagnosis not present

## 2018-09-19 DIAGNOSIS — R928 Other abnormal and inconclusive findings on diagnostic imaging of breast: Secondary | ICD-10-CM | POA: Diagnosis not present

## 2018-09-19 DIAGNOSIS — R922 Inconclusive mammogram: Secondary | ICD-10-CM | POA: Diagnosis not present

## 2018-09-26 DIAGNOSIS — Z79899 Other long term (current) drug therapy: Secondary | ICD-10-CM | POA: Diagnosis not present

## 2018-09-26 DIAGNOSIS — K219 Gastro-esophageal reflux disease without esophagitis: Secondary | ICD-10-CM | POA: Diagnosis not present

## 2018-09-26 DIAGNOSIS — Z6841 Body Mass Index (BMI) 40.0 and over, adult: Secondary | ICD-10-CM | POA: Diagnosis not present

## 2018-09-26 DIAGNOSIS — Z7189 Other specified counseling: Secondary | ICD-10-CM | POA: Diagnosis not present

## 2018-09-26 DIAGNOSIS — M797 Fibromyalgia: Secondary | ICD-10-CM | POA: Diagnosis not present

## 2018-09-28 DIAGNOSIS — M797 Fibromyalgia: Secondary | ICD-10-CM | POA: Diagnosis not present

## 2018-09-28 DIAGNOSIS — Z7189 Other specified counseling: Secondary | ICD-10-CM | POA: Diagnosis not present

## 2018-09-28 DIAGNOSIS — K219 Gastro-esophageal reflux disease without esophagitis: Secondary | ICD-10-CM | POA: Diagnosis not present

## 2018-09-28 DIAGNOSIS — Z6841 Body Mass Index (BMI) 40.0 and over, adult: Secondary | ICD-10-CM | POA: Diagnosis not present

## 2018-10-03 DIAGNOSIS — N939 Abnormal uterine and vaginal bleeding, unspecified: Secondary | ICD-10-CM | POA: Diagnosis not present

## 2018-10-09 DIAGNOSIS — N3946 Mixed incontinence: Secondary | ICD-10-CM | POA: Diagnosis not present

## 2018-10-17 DIAGNOSIS — Z6841 Body Mass Index (BMI) 40.0 and over, adult: Secondary | ICD-10-CM | POA: Diagnosis not present

## 2018-10-17 DIAGNOSIS — Z1159 Encounter for screening for other viral diseases: Secondary | ICD-10-CM | POA: Diagnosis not present

## 2018-10-17 DIAGNOSIS — K219 Gastro-esophageal reflux disease without esophagitis: Secondary | ICD-10-CM | POA: Diagnosis not present

## 2018-10-19 DIAGNOSIS — K3189 Other diseases of stomach and duodenum: Secondary | ICD-10-CM | POA: Diagnosis not present

## 2018-10-19 DIAGNOSIS — K259 Gastric ulcer, unspecified as acute or chronic, without hemorrhage or perforation: Secondary | ICD-10-CM | POA: Diagnosis not present

## 2018-10-19 DIAGNOSIS — K210 Gastro-esophageal reflux disease with esophagitis: Secondary | ICD-10-CM | POA: Diagnosis not present

## 2018-10-19 DIAGNOSIS — Z6841 Body Mass Index (BMI) 40.0 and over, adult: Secondary | ICD-10-CM | POA: Diagnosis not present

## 2018-10-19 DIAGNOSIS — K209 Esophagitis, unspecified: Secondary | ICD-10-CM | POA: Diagnosis not present

## 2018-10-19 DIAGNOSIS — K257 Chronic gastric ulcer without hemorrhage or perforation: Secondary | ICD-10-CM | POA: Diagnosis not present

## 2018-10-23 DIAGNOSIS — N939 Abnormal uterine and vaginal bleeding, unspecified: Secondary | ICD-10-CM | POA: Diagnosis not present

## 2018-10-29 DIAGNOSIS — M1990 Unspecified osteoarthritis, unspecified site: Secondary | ICD-10-CM | POA: Diagnosis not present

## 2018-10-29 DIAGNOSIS — Z6841 Body Mass Index (BMI) 40.0 and over, adult: Secondary | ICD-10-CM | POA: Diagnosis not present

## 2018-10-29 DIAGNOSIS — K259 Gastric ulcer, unspecified as acute or chronic, without hemorrhage or perforation: Secondary | ICD-10-CM | POA: Diagnosis not present

## 2018-10-29 DIAGNOSIS — Z7189 Other specified counseling: Secondary | ICD-10-CM | POA: Diagnosis not present

## 2018-11-06 DIAGNOSIS — N3946 Mixed incontinence: Secondary | ICD-10-CM | POA: Diagnosis not present

## 2018-11-08 ENCOUNTER — Ambulatory Visit: Payer: Medicaid Other | Admitting: Nurse Practitioner

## 2018-11-08 ENCOUNTER — Encounter: Payer: Self-pay | Admitting: Nurse Practitioner

## 2018-11-08 ENCOUNTER — Other Ambulatory Visit: Payer: Self-pay

## 2018-11-08 DIAGNOSIS — Z113 Encounter for screening for infections with a predominantly sexual mode of transmission: Secondary | ICD-10-CM | POA: Diagnosis not present

## 2018-11-08 DIAGNOSIS — N898 Other specified noninflammatory disorders of vagina: Secondary | ICD-10-CM | POA: Diagnosis not present

## 2018-11-08 DIAGNOSIS — Z1388 Encounter for screening for disorder due to exposure to contaminants: Secondary | ICD-10-CM | POA: Diagnosis not present

## 2018-11-08 DIAGNOSIS — B9689 Other specified bacterial agents as the cause of diseases classified elsewhere: Secondary | ICD-10-CM | POA: Diagnosis not present

## 2018-11-08 DIAGNOSIS — N760 Acute vaginitis: Secondary | ICD-10-CM

## 2018-11-08 DIAGNOSIS — Z0389 Encounter for observation for other suspected diseases and conditions ruled out: Secondary | ICD-10-CM | POA: Diagnosis not present

## 2018-11-08 DIAGNOSIS — Z3009 Encounter for other general counseling and advice on contraception: Secondary | ICD-10-CM | POA: Diagnosis not present

## 2018-11-08 LAB — WET PREP FOR TRICH, YEAST, CLUE
Trichomonas Exam: NEGATIVE
Yeast Exam: NEGATIVE

## 2018-11-08 MED ORDER — METRONIDAZOLE 500 MG PO TABS: 500.0000 mg | ORAL_TABLET | Freq: Two times a day (BID) | ORAL | 0 refills | Status: AC

## 2018-11-08 NOTE — Progress Notes (Signed)
Right knee surgery 2002 Gall bladder removed 2015  LMP - 1 1/2 weeks ago Birth control - denies use of BCM at this time - going through divorce and not interested in method discussion   Claritin prn  STI clinic/screening visit  Subjective:  Paula Mccormick is a 48 y.o. female being seen today for an STI screening visit. The patient reports they do have symptoms.  Patient has the following medical conditionshas AKI (acute kidney injury) ([place]); Chest pain with moderate risk for cardiac etiology; Essential hypertension; Acute renal failure ([place]); Dehydration; and SOB (shortness of breath) on their problem list.  No chief complaint on file.   Patient reports  HPI   See flowsheet for further details and programmatic requirements.    The following portions of the patient's history were reviewed and updated as appropriate: allergies, current medications, past family history, past medical history, past social history, past surgical history and problem list. Problem list updated.  Objective:  There were no vitals filed for this visit.  Physical Exam Constitutional:      Appearance: Normal appearance. She is obese.  HENT:     Head: Normocephalic.     Nose: Nose normal.  Neck:     Musculoskeletal: Full passive range of motion without pain, normal range of motion and neck supple.     Thyroid: No thyroid mass.  Abdominal:     Palpations: Abdomen is soft.  Genitourinary:    General: Normal vulva.     Exam position: Lithotomy position.     Labia:        Right: No rash.        Left: No rash.      Vagina: Vaginal discharge present.     Cervix: Discharge (small amt tan/grey discharge noted) present. No cervical motion tenderness.     Uterus: Normal.      Rectum: Normal.     Comments: Ph - >4.5 Lymphadenopathy:     Cervical: No cervical adenopathy.  Skin:    General: Skin is warm and dry.  Neurological:     Mental Status: She is alert.  Psychiatric:        Behavior: Behavior  is cooperative.       Assessment and Plan:  Paula Mccormick is a 48 y.o. female presenting to the [facility] Department for STI screening  There are no diagnoses linked to this encounter. Await STI  lab results -  [place] and GC/Chlamydia, HIV and Syphilis. Advised client that she would be notified if test results positive.  Advise client to continue with consistent condom use for all sex. Provider to review wet[place] results.  Client symptomatic.  Client verbalizes understanding and is in agreement with plan of care.    No follow-ups on file.  No future appointments.  Berniece Andreas, NP

## 2018-11-08 NOTE — Patient Instructions (Signed)

## 2018-11-08 NOTE — Progress Notes (Signed)
[place] reviewed by provider; per verbal order by Jerline Pain, FNP, pt treated for BV per standing order. Provider orders completed.

## 2018-11-19 ENCOUNTER — Telehealth: Payer: Self-pay | Admitting: Family Medicine

## 2018-11-19 NOTE — Telephone Encounter (Signed)
Patient wants to know her test results from last visit. °

## 2018-11-20 DIAGNOSIS — N393 Stress incontinence (female) (male): Secondary | ICD-10-CM | POA: Diagnosis not present

## 2018-11-21 NOTE — Telephone Encounter (Signed)
LM for patient to check my chart for results and if she has any problems to call [facility] back. Aileen Fass, RN

## 2018-11-22 DIAGNOSIS — I10 Essential (primary) hypertension: Secondary | ICD-10-CM | POA: Diagnosis not present

## 2018-11-22 DIAGNOSIS — F1111 Opioid abuse, in remission: Secondary | ICD-10-CM | POA: Diagnosis not present

## 2018-11-22 DIAGNOSIS — G894 Chronic pain syndrome: Secondary | ICD-10-CM | POA: Diagnosis not present

## 2018-11-22 DIAGNOSIS — Z01818 Encounter for other preprocedural examination: Secondary | ICD-10-CM | POA: Diagnosis not present

## 2018-11-23 DIAGNOSIS — I10 Essential (primary) hypertension: Secondary | ICD-10-CM | POA: Diagnosis not present

## 2018-11-23 DIAGNOSIS — D509 Iron deficiency anemia, unspecified: Secondary | ICD-10-CM | POA: Diagnosis not present

## 2018-11-23 DIAGNOSIS — E669 Obesity, unspecified: Secondary | ICD-10-CM | POA: Diagnosis not present

## 2018-11-23 DIAGNOSIS — K219 Gastro-esophageal reflux disease without esophagitis: Secondary | ICD-10-CM | POA: Diagnosis not present

## 2018-11-29 DIAGNOSIS — D2339 Other benign neoplasm of skin of other parts of face: Secondary | ICD-10-CM | POA: Diagnosis not present

## 2018-11-29 DIAGNOSIS — L578 Other skin changes due to chronic exposure to nonionizing radiation: Secondary | ICD-10-CM | POA: Diagnosis not present

## 2018-11-29 DIAGNOSIS — L918 Other hypertrophic disorders of the skin: Secondary | ICD-10-CM | POA: Diagnosis not present

## 2018-11-29 DIAGNOSIS — D2372 Other benign neoplasm of skin of left lower limb, including hip: Secondary | ICD-10-CM | POA: Diagnosis not present

## 2018-11-29 DIAGNOSIS — D229 Melanocytic nevi, unspecified: Secondary | ICD-10-CM | POA: Diagnosis not present

## 2018-12-01 DIAGNOSIS — Z1159 Encounter for screening for other viral diseases: Secondary | ICD-10-CM | POA: Diagnosis not present

## 2018-12-03 DIAGNOSIS — N393 Stress incontinence (female) (male): Secondary | ICD-10-CM | POA: Diagnosis not present

## 2018-12-03 DIAGNOSIS — N8111 Cystocele, midline: Secondary | ICD-10-CM | POA: Diagnosis not present

## 2018-12-04 DIAGNOSIS — K219 Gastro-esophageal reflux disease without esophagitis: Secondary | ICD-10-CM | POA: Diagnosis not present

## 2018-12-04 DIAGNOSIS — I10 Essential (primary) hypertension: Secondary | ICD-10-CM | POA: Diagnosis not present

## 2018-12-04 DIAGNOSIS — Z6841 Body Mass Index (BMI) 40.0 and over, adult: Secondary | ICD-10-CM | POA: Diagnosis not present

## 2018-12-05 DIAGNOSIS — Z885 Allergy status to narcotic agent status: Secondary | ICD-10-CM | POA: Diagnosis not present

## 2018-12-05 DIAGNOSIS — R109 Unspecified abdominal pain: Secondary | ICD-10-CM | POA: Diagnosis not present

## 2018-12-05 DIAGNOSIS — N3289 Other specified disorders of bladder: Secondary | ICD-10-CM | POA: Diagnosis not present

## 2018-12-05 DIAGNOSIS — Z87442 Personal history of urinary calculi: Secondary | ICD-10-CM | POA: Diagnosis not present

## 2018-12-05 DIAGNOSIS — T83098A Other mechanical complication of other indwelling urethral catheter, initial encounter: Secondary | ICD-10-CM | POA: Diagnosis not present

## 2018-12-05 DIAGNOSIS — Z9049 Acquired absence of other specified parts of digestive tract: Secondary | ICD-10-CM | POA: Diagnosis not present

## 2018-12-05 DIAGNOSIS — I10 Essential (primary) hypertension: Secondary | ICD-10-CM | POA: Diagnosis not present

## 2018-12-05 DIAGNOSIS — K219 Gastro-esophageal reflux disease without esophagitis: Secondary | ICD-10-CM | POA: Diagnosis not present

## 2018-12-05 DIAGNOSIS — T8389XA Other specified complication of genitourinary prosthetic devices, implants and grafts, initial encounter: Secondary | ICD-10-CM | POA: Diagnosis not present

## 2018-12-05 DIAGNOSIS — F419 Anxiety disorder, unspecified: Secondary | ICD-10-CM | POA: Diagnosis not present

## 2018-12-05 DIAGNOSIS — Z888 Allergy status to other drugs, medicaments and biological substances status: Secondary | ICD-10-CM | POA: Diagnosis not present

## 2018-12-05 DIAGNOSIS — R1030 Lower abdominal pain, unspecified: Secondary | ICD-10-CM | POA: Diagnosis not present

## 2018-12-05 DIAGNOSIS — Z8711 Personal history of peptic ulcer disease: Secondary | ICD-10-CM | POA: Diagnosis not present

## 2018-12-05 DIAGNOSIS — Z79899 Other long term (current) drug therapy: Secondary | ICD-10-CM | POA: Diagnosis not present

## 2018-12-14 DIAGNOSIS — M170 Bilateral primary osteoarthritis of knee: Secondary | ICD-10-CM | POA: Diagnosis not present

## 2018-12-20 DIAGNOSIS — I10 Essential (primary) hypertension: Secondary | ICD-10-CM | POA: Diagnosis not present

## 2018-12-20 DIAGNOSIS — M170 Bilateral primary osteoarthritis of knee: Secondary | ICD-10-CM | POA: Diagnosis not present

## 2018-12-20 DIAGNOSIS — F419 Anxiety disorder, unspecified: Secondary | ICD-10-CM | POA: Diagnosis not present

## 2018-12-20 DIAGNOSIS — F1111 Opioid abuse, in remission: Secondary | ICD-10-CM | POA: Diagnosis not present

## 2018-12-20 DIAGNOSIS — G4700 Insomnia, unspecified: Secondary | ICD-10-CM | POA: Diagnosis not present

## 2018-12-20 DIAGNOSIS — G894 Chronic pain syndrome: Secondary | ICD-10-CM | POA: Diagnosis not present

## 2018-12-21 DIAGNOSIS — F54 Psychological and behavioral factors associated with disorders or diseases classified elsewhere: Secondary | ICD-10-CM | POA: Diagnosis not present

## 2018-12-21 DIAGNOSIS — Z008 Encounter for other general examination: Secondary | ICD-10-CM | POA: Diagnosis not present

## 2018-12-26 DIAGNOSIS — Z79899 Other long term (current) drug therapy: Secondary | ICD-10-CM | POA: Diagnosis not present

## 2018-12-26 DIAGNOSIS — Z885 Allergy status to narcotic agent status: Secondary | ICD-10-CM | POA: Diagnosis not present

## 2018-12-26 DIAGNOSIS — Z6841 Body Mass Index (BMI) 40.0 and over, adult: Secondary | ICD-10-CM | POA: Diagnosis not present

## 2018-12-26 DIAGNOSIS — I10 Essential (primary) hypertension: Secondary | ICD-10-CM | POA: Diagnosis not present

## 2018-12-26 DIAGNOSIS — K219 Gastro-esophageal reflux disease without esophagitis: Secondary | ICD-10-CM | POA: Diagnosis not present

## 2018-12-28 DIAGNOSIS — Z6841 Body Mass Index (BMI) 40.0 and over, adult: Secondary | ICD-10-CM | POA: Diagnosis not present

## 2018-12-28 DIAGNOSIS — Z7189 Other specified counseling: Secondary | ICD-10-CM | POA: Diagnosis not present

## 2019-01-10 DIAGNOSIS — H5213 Myopia, bilateral: Secondary | ICD-10-CM | POA: Diagnosis not present

## 2019-01-10 DIAGNOSIS — H524 Presbyopia: Secondary | ICD-10-CM | POA: Diagnosis not present

## 2019-01-10 DIAGNOSIS — H52223 Regular astigmatism, bilateral: Secondary | ICD-10-CM | POA: Diagnosis not present

## 2019-01-29 DIAGNOSIS — H5213 Myopia, bilateral: Secondary | ICD-10-CM | POA: Diagnosis not present

## 2019-01-30 DIAGNOSIS — R300 Dysuria: Secondary | ICD-10-CM | POA: Diagnosis not present

## 2019-02-15 DIAGNOSIS — I10 Essential (primary) hypertension: Secondary | ICD-10-CM | POA: Diagnosis not present

## 2019-02-15 DIAGNOSIS — G894 Chronic pain syndrome: Secondary | ICD-10-CM | POA: Diagnosis not present

## 2019-03-14 DIAGNOSIS — G4700 Insomnia, unspecified: Secondary | ICD-10-CM | POA: Diagnosis not present

## 2019-03-14 DIAGNOSIS — G894 Chronic pain syndrome: Secondary | ICD-10-CM | POA: Diagnosis not present

## 2019-03-14 DIAGNOSIS — R519 Headache, unspecified: Secondary | ICD-10-CM | POA: Diagnosis not present

## 2019-04-01 DIAGNOSIS — Z20828 Contact with and (suspected) exposure to other viral communicable diseases: Secondary | ICD-10-CM | POA: Diagnosis not present

## 2019-04-01 DIAGNOSIS — Z01812 Encounter for preprocedural laboratory examination: Secondary | ICD-10-CM | POA: Diagnosis not present

## 2019-04-03 DIAGNOSIS — Z6841 Body Mass Index (BMI) 40.0 and over, adult: Secondary | ICD-10-CM | POA: Diagnosis not present

## 2019-04-03 DIAGNOSIS — K219 Gastro-esophageal reflux disease without esophagitis: Secondary | ICD-10-CM | POA: Diagnosis not present

## 2019-04-03 DIAGNOSIS — I10 Essential (primary) hypertension: Secondary | ICD-10-CM | POA: Diagnosis not present

## 2019-04-04 DIAGNOSIS — Z9884 Bariatric surgery status: Secondary | ICD-10-CM | POA: Diagnosis not present

## 2019-04-11 DIAGNOSIS — G894 Chronic pain syndrome: Secondary | ICD-10-CM | POA: Diagnosis not present

## 2019-04-11 DIAGNOSIS — R251 Tremor, unspecified: Secondary | ICD-10-CM | POA: Diagnosis not present

## 2019-04-11 DIAGNOSIS — F419 Anxiety disorder, unspecified: Secondary | ICD-10-CM | POA: Diagnosis not present

## 2019-04-11 DIAGNOSIS — K219 Gastro-esophageal reflux disease without esophagitis: Secondary | ICD-10-CM | POA: Diagnosis not present

## 2019-04-11 DIAGNOSIS — F321 Major depressive disorder, single episode, moderate: Secondary | ICD-10-CM | POA: Diagnosis not present

## 2019-04-11 DIAGNOSIS — G4700 Insomnia, unspecified: Secondary | ICD-10-CM | POA: Diagnosis not present

## 2019-04-11 DIAGNOSIS — I10 Essential (primary) hypertension: Secondary | ICD-10-CM | POA: Diagnosis not present

## 2019-04-11 DIAGNOSIS — R519 Headache, unspecified: Secondary | ICD-10-CM | POA: Diagnosis not present

## 2019-05-13 DIAGNOSIS — R7401 Elevation of levels of liver transaminase levels: Secondary | ICD-10-CM | POA: Diagnosis not present

## 2019-05-13 DIAGNOSIS — G894 Chronic pain syndrome: Secondary | ICD-10-CM | POA: Diagnosis not present

## 2019-05-13 DIAGNOSIS — M79662 Pain in left lower leg: Secondary | ICD-10-CM | POA: Diagnosis not present

## 2019-05-13 DIAGNOSIS — R1011 Right upper quadrant pain: Secondary | ICD-10-CM | POA: Diagnosis not present

## 2019-05-13 DIAGNOSIS — I10 Essential (primary) hypertension: Secondary | ICD-10-CM | POA: Diagnosis not present

## 2019-05-13 DIAGNOSIS — F321 Major depressive disorder, single episode, moderate: Secondary | ICD-10-CM | POA: Diagnosis not present

## 2019-05-17 DIAGNOSIS — R7401 Elevation of levels of liver transaminase levels: Secondary | ICD-10-CM | POA: Diagnosis not present

## 2019-05-17 DIAGNOSIS — N200 Calculus of kidney: Secondary | ICD-10-CM | POA: Diagnosis not present

## 2019-05-17 DIAGNOSIS — R933 Abnormal findings on diagnostic imaging of other parts of digestive tract: Secondary | ICD-10-CM | POA: Diagnosis not present

## 2019-05-17 DIAGNOSIS — K5730 Diverticulosis of large intestine without perforation or abscess without bleeding: Secondary | ICD-10-CM | POA: Diagnosis not present

## 2019-05-22 DIAGNOSIS — M79662 Pain in left lower leg: Secondary | ICD-10-CM | POA: Diagnosis not present

## 2019-05-22 DIAGNOSIS — Z9884 Bariatric surgery status: Secondary | ICD-10-CM | POA: Diagnosis not present

## 2019-05-22 DIAGNOSIS — M7989 Other specified soft tissue disorders: Secondary | ICD-10-CM | POA: Diagnosis not present

## 2019-07-10 DIAGNOSIS — E569 Vitamin deficiency, unspecified: Secondary | ICD-10-CM | POA: Diagnosis not present

## 2019-07-10 DIAGNOSIS — Z79899 Other long term (current) drug therapy: Secondary | ICD-10-CM | POA: Diagnosis not present

## 2019-07-10 DIAGNOSIS — I10 Essential (primary) hypertension: Secondary | ICD-10-CM | POA: Diagnosis not present

## 2019-07-10 DIAGNOSIS — Z6835 Body mass index (BMI) 35.0-35.9, adult: Secondary | ICD-10-CM | POA: Diagnosis not present

## 2019-07-10 DIAGNOSIS — R634 Abnormal weight loss: Secondary | ICD-10-CM | POA: Diagnosis not present

## 2019-07-10 DIAGNOSIS — Z9884 Bariatric surgery status: Secondary | ICD-10-CM | POA: Diagnosis not present

## 2019-07-11 DIAGNOSIS — Z6835 Body mass index (BMI) 35.0-35.9, adult: Secondary | ICD-10-CM | POA: Diagnosis not present

## 2019-07-11 DIAGNOSIS — F321 Major depressive disorder, single episode, moderate: Secondary | ICD-10-CM | POA: Diagnosis not present

## 2019-07-11 DIAGNOSIS — M79662 Pain in left lower leg: Secondary | ICD-10-CM | POA: Diagnosis not present

## 2019-07-11 DIAGNOSIS — G4700 Insomnia, unspecified: Secondary | ICD-10-CM | POA: Diagnosis not present

## 2019-07-11 DIAGNOSIS — F419 Anxiety disorder, unspecified: Secondary | ICD-10-CM | POA: Diagnosis not present

## 2019-07-11 DIAGNOSIS — Z885 Allergy status to narcotic agent status: Secondary | ICD-10-CM | POA: Diagnosis not present

## 2019-07-11 DIAGNOSIS — Z9049 Acquired absence of other specified parts of digestive tract: Secondary | ICD-10-CM | POA: Diagnosis not present

## 2019-07-11 DIAGNOSIS — R251 Tremor, unspecified: Secondary | ICD-10-CM | POA: Diagnosis not present

## 2019-07-11 DIAGNOSIS — R519 Headache, unspecified: Secondary | ICD-10-CM | POA: Diagnosis not present

## 2019-07-11 DIAGNOSIS — G894 Chronic pain syndrome: Secondary | ICD-10-CM | POA: Diagnosis not present

## 2019-07-11 DIAGNOSIS — I10 Essential (primary) hypertension: Secondary | ICD-10-CM | POA: Diagnosis not present

## 2019-07-11 DIAGNOSIS — J309 Allergic rhinitis, unspecified: Secondary | ICD-10-CM | POA: Diagnosis not present

## 2019-07-11 DIAGNOSIS — M170 Bilateral primary osteoarthritis of knee: Secondary | ICD-10-CM | POA: Diagnosis not present

## 2019-07-22 DIAGNOSIS — M545 Low back pain: Secondary | ICD-10-CM | POA: Diagnosis not present

## 2019-07-22 DIAGNOSIS — M5136 Other intervertebral disc degeneration, lumbar region: Secondary | ICD-10-CM | POA: Diagnosis not present

## 2019-07-22 DIAGNOSIS — M47816 Spondylosis without myelopathy or radiculopathy, lumbar region: Secondary | ICD-10-CM | POA: Diagnosis not present

## 2019-07-22 DIAGNOSIS — M170 Bilateral primary osteoarthritis of knee: Secondary | ICD-10-CM | POA: Diagnosis not present

## 2019-07-22 DIAGNOSIS — M542 Cervicalgia: Secondary | ICD-10-CM | POA: Diagnosis not present

## 2019-07-30 DIAGNOSIS — M170 Bilateral primary osteoarthritis of knee: Secondary | ICD-10-CM | POA: Diagnosis not present

## 2019-08-19 DIAGNOSIS — M47812 Spondylosis without myelopathy or radiculopathy, cervical region: Secondary | ICD-10-CM | POA: Diagnosis not present

## 2019-08-19 DIAGNOSIS — M4802 Spinal stenosis, cervical region: Secondary | ICD-10-CM | POA: Diagnosis not present

## 2019-08-19 DIAGNOSIS — M545 Low back pain: Secondary | ICD-10-CM | POA: Diagnosis not present

## 2019-08-19 DIAGNOSIS — M9973 Connective tissue and disc stenosis of intervertebral foramina of lumbar region: Secondary | ICD-10-CM | POA: Diagnosis not present

## 2019-08-19 DIAGNOSIS — M9971 Connective tissue and disc stenosis of intervertebral foramina of cervical region: Secondary | ICD-10-CM | POA: Diagnosis not present

## 2019-08-19 DIAGNOSIS — M47816 Spondylosis without myelopathy or radiculopathy, lumbar region: Secondary | ICD-10-CM | POA: Diagnosis not present

## 2019-08-19 DIAGNOSIS — M48061 Spinal stenosis, lumbar region without neurogenic claudication: Secondary | ICD-10-CM | POA: Diagnosis not present

## 2019-08-19 DIAGNOSIS — M542 Cervicalgia: Secondary | ICD-10-CM | POA: Diagnosis not present

## 2019-08-20 DIAGNOSIS — G479 Sleep disorder, unspecified: Secondary | ICD-10-CM | POA: Diagnosis not present

## 2019-08-20 DIAGNOSIS — F330 Major depressive disorder, recurrent, mild: Secondary | ICD-10-CM | POA: Diagnosis not present

## 2019-09-02 DIAGNOSIS — G894 Chronic pain syndrome: Secondary | ICD-10-CM | POA: Diagnosis not present

## 2019-09-02 DIAGNOSIS — M4802 Spinal stenosis, cervical region: Secondary | ICD-10-CM | POA: Diagnosis not present

## 2019-09-02 DIAGNOSIS — M170 Bilateral primary osteoarthritis of knee: Secondary | ICD-10-CM | POA: Diagnosis not present

## 2019-09-02 DIAGNOSIS — M47816 Spondylosis without myelopathy or radiculopathy, lumbar region: Secondary | ICD-10-CM | POA: Diagnosis not present

## 2019-09-02 DIAGNOSIS — M542 Cervicalgia: Secondary | ICD-10-CM | POA: Diagnosis not present

## 2019-09-02 DIAGNOSIS — M5136 Other intervertebral disc degeneration, lumbar region: Secondary | ICD-10-CM | POA: Diagnosis not present

## 2019-09-04 DIAGNOSIS — G894 Chronic pain syndrome: Secondary | ICD-10-CM | POA: Diagnosis not present

## 2019-09-04 DIAGNOSIS — R110 Nausea: Secondary | ICD-10-CM | POA: Diagnosis not present

## 2019-09-04 DIAGNOSIS — M170 Bilateral primary osteoarthritis of knee: Secondary | ICD-10-CM | POA: Diagnosis not present

## 2019-09-12 DIAGNOSIS — Z79899 Other long term (current) drug therapy: Secondary | ICD-10-CM | POA: Diagnosis not present

## 2019-09-12 DIAGNOSIS — F419 Anxiety disorder, unspecified: Secondary | ICD-10-CM | POA: Diagnosis not present

## 2019-09-12 DIAGNOSIS — I10 Essential (primary) hypertension: Secondary | ICD-10-CM | POA: Diagnosis not present

## 2019-09-12 DIAGNOSIS — G894 Chronic pain syndrome: Secondary | ICD-10-CM | POA: Diagnosis not present

## 2019-09-12 DIAGNOSIS — R1011 Right upper quadrant pain: Secondary | ICD-10-CM | POA: Diagnosis not present

## 2019-09-17 DIAGNOSIS — N951 Menopausal and female climacteric states: Secondary | ICD-10-CM | POA: Diagnosis not present

## 2019-09-17 DIAGNOSIS — N393 Stress incontinence (female) (male): Secondary | ICD-10-CM | POA: Diagnosis not present

## 2019-10-11 DIAGNOSIS — Z9884 Bariatric surgery status: Secondary | ICD-10-CM | POA: Diagnosis not present

## 2019-10-11 DIAGNOSIS — E569 Vitamin deficiency, unspecified: Secondary | ICD-10-CM | POA: Diagnosis not present

## 2019-10-11 DIAGNOSIS — R5383 Other fatigue: Secondary | ICD-10-CM | POA: Diagnosis not present

## 2019-10-11 DIAGNOSIS — Z6831 Body mass index (BMI) 31.0-31.9, adult: Secondary | ICD-10-CM | POA: Diagnosis not present

## 2019-10-11 DIAGNOSIS — N920 Excessive and frequent menstruation with regular cycle: Secondary | ICD-10-CM | POA: Diagnosis not present

## 2019-10-11 DIAGNOSIS — R110 Nausea: Secondary | ICD-10-CM | POA: Diagnosis not present

## 2019-10-11 DIAGNOSIS — Z1321 Encounter for screening for nutritional disorder: Secondary | ICD-10-CM | POA: Diagnosis not present

## 2019-10-16 DIAGNOSIS — Z1321 Encounter for screening for nutritional disorder: Secondary | ICD-10-CM | POA: Diagnosis not present

## 2019-10-16 DIAGNOSIS — Z6831 Body mass index (BMI) 31.0-31.9, adult: Secondary | ICD-10-CM | POA: Diagnosis not present

## 2019-10-16 DIAGNOSIS — Z9884 Bariatric surgery status: Secondary | ICD-10-CM | POA: Diagnosis not present

## 2019-10-24 DIAGNOSIS — D509 Iron deficiency anemia, unspecified: Secondary | ICD-10-CM | POA: Diagnosis not present

## 2019-10-24 DIAGNOSIS — Z9884 Bariatric surgery status: Secondary | ICD-10-CM | POA: Diagnosis not present

## 2019-10-24 DIAGNOSIS — N921 Excessive and frequent menstruation with irregular cycle: Secondary | ICD-10-CM | POA: Diagnosis not present

## 2019-10-25 DIAGNOSIS — E669 Obesity, unspecified: Secondary | ICD-10-CM | POA: Diagnosis not present

## 2019-10-25 DIAGNOSIS — Z6830 Body mass index (BMI) 30.0-30.9, adult: Secondary | ICD-10-CM | POA: Diagnosis not present

## 2019-10-25 DIAGNOSIS — Z9884 Bariatric surgery status: Secondary | ICD-10-CM | POA: Diagnosis not present

## 2019-11-11 DIAGNOSIS — M170 Bilateral primary osteoarthritis of knee: Secondary | ICD-10-CM | POA: Diagnosis not present

## 2019-11-11 DIAGNOSIS — I10 Essential (primary) hypertension: Secondary | ICD-10-CM | POA: Diagnosis not present

## 2019-11-11 DIAGNOSIS — K6289 Other specified diseases of anus and rectum: Secondary | ICD-10-CM | POA: Diagnosis not present

## 2019-11-11 DIAGNOSIS — F419 Anxiety disorder, unspecified: Secondary | ICD-10-CM | POA: Diagnosis not present

## 2019-11-11 DIAGNOSIS — G894 Chronic pain syndrome: Secondary | ICD-10-CM | POA: Diagnosis not present

## 2019-11-29 DIAGNOSIS — J019 Acute sinusitis, unspecified: Secondary | ICD-10-CM | POA: Diagnosis not present

## 2019-11-29 DIAGNOSIS — R070 Pain in throat: Secondary | ICD-10-CM | POA: Diagnosis not present

## 2019-12-03 DIAGNOSIS — R112 Nausea with vomiting, unspecified: Secondary | ICD-10-CM | POA: Diagnosis not present

## 2019-12-03 DIAGNOSIS — Z20822 Contact with and (suspected) exposure to covid-19: Secondary | ICD-10-CM | POA: Diagnosis not present

## 2019-12-03 DIAGNOSIS — Z87442 Personal history of urinary calculi: Secondary | ICD-10-CM | POA: Diagnosis not present

## 2019-12-03 DIAGNOSIS — N200 Calculus of kidney: Secondary | ICD-10-CM | POA: Diagnosis not present

## 2019-12-03 DIAGNOSIS — N132 Hydronephrosis with renal and ureteral calculous obstruction: Secondary | ICD-10-CM | POA: Diagnosis not present

## 2019-12-03 DIAGNOSIS — R1012 Left upper quadrant pain: Secondary | ICD-10-CM | POA: Diagnosis not present

## 2019-12-03 DIAGNOSIS — N134 Hydroureter: Secondary | ICD-10-CM | POA: Diagnosis not present

## 2019-12-03 DIAGNOSIS — Z9884 Bariatric surgery status: Secondary | ICD-10-CM | POA: Diagnosis not present

## 2019-12-12 DIAGNOSIS — R251 Tremor, unspecified: Secondary | ICD-10-CM | POA: Diagnosis not present

## 2019-12-12 DIAGNOSIS — F419 Anxiety disorder, unspecified: Secondary | ICD-10-CM | POA: Diagnosis not present

## 2019-12-12 DIAGNOSIS — G894 Chronic pain syndrome: Secondary | ICD-10-CM | POA: Diagnosis not present

## 2019-12-12 DIAGNOSIS — J210 Acute bronchiolitis due to respiratory syncytial virus: Secondary | ICD-10-CM | POA: Diagnosis not present

## 2019-12-12 DIAGNOSIS — I10 Essential (primary) hypertension: Secondary | ICD-10-CM | POA: Diagnosis not present

## 2019-12-12 DIAGNOSIS — N200 Calculus of kidney: Secondary | ICD-10-CM | POA: Diagnosis not present

## 2019-12-20 DIAGNOSIS — Z01812 Encounter for preprocedural laboratory examination: Secondary | ICD-10-CM | POA: Diagnosis not present

## 2019-12-20 DIAGNOSIS — Z20822 Contact with and (suspected) exposure to covid-19: Secondary | ICD-10-CM | POA: Diagnosis not present

## 2019-12-24 DIAGNOSIS — N200 Calculus of kidney: Secondary | ICD-10-CM | POA: Diagnosis not present

## 2019-12-24 DIAGNOSIS — N201 Calculus of ureter: Secondary | ICD-10-CM | POA: Diagnosis not present

## 2020-01-02 DIAGNOSIS — M50322 Other cervical disc degeneration at C5-C6 level: Secondary | ICD-10-CM | POA: Diagnosis not present

## 2020-01-02 DIAGNOSIS — R109 Unspecified abdominal pain: Secondary | ICD-10-CM | POA: Diagnosis not present

## 2020-01-02 DIAGNOSIS — R509 Fever, unspecified: Secondary | ICD-10-CM | POA: Diagnosis not present

## 2020-01-02 DIAGNOSIS — S0990XA Unspecified injury of head, initial encounter: Secondary | ICD-10-CM | POA: Diagnosis not present

## 2020-01-02 DIAGNOSIS — N12 Tubulo-interstitial nephritis, not specified as acute or chronic: Secondary | ICD-10-CM | POA: Diagnosis not present

## 2020-01-02 DIAGNOSIS — S199XXA Unspecified injury of neck, initial encounter: Secondary | ICD-10-CM | POA: Diagnosis not present

## 2020-01-02 DIAGNOSIS — Z20822 Contact with and (suspected) exposure to covid-19: Secondary | ICD-10-CM | POA: Diagnosis not present

## 2020-01-02 DIAGNOSIS — N83202 Unspecified ovarian cyst, left side: Secondary | ICD-10-CM | POA: Diagnosis not present

## 2020-01-02 DIAGNOSIS — R9431 Abnormal electrocardiogram [ECG] [EKG]: Secondary | ICD-10-CM | POA: Diagnosis not present

## 2020-01-02 DIAGNOSIS — S299XXA Unspecified injury of thorax, initial encounter: Secondary | ICD-10-CM | POA: Diagnosis not present

## 2020-01-02 DIAGNOSIS — R6889 Other general symptoms and signs: Secondary | ICD-10-CM | POA: Diagnosis not present

## 2020-01-02 DIAGNOSIS — M5134 Other intervertebral disc degeneration, thoracic region: Secondary | ICD-10-CM | POA: Diagnosis not present

## 2020-01-02 DIAGNOSIS — N200 Calculus of kidney: Secondary | ICD-10-CM | POA: Diagnosis not present

## 2020-01-02 DIAGNOSIS — R1032 Left lower quadrant pain: Secondary | ICD-10-CM | POA: Diagnosis not present

## 2020-01-03 DIAGNOSIS — N200 Calculus of kidney: Secondary | ICD-10-CM | POA: Diagnosis not present

## 2020-01-03 DIAGNOSIS — M50322 Other cervical disc degeneration at C5-C6 level: Secondary | ICD-10-CM | POA: Diagnosis not present

## 2020-01-03 DIAGNOSIS — S299XXA Unspecified injury of thorax, initial encounter: Secondary | ICD-10-CM | POA: Diagnosis not present

## 2020-01-03 DIAGNOSIS — S0990XA Unspecified injury of head, initial encounter: Secondary | ICD-10-CM | POA: Diagnosis not present

## 2020-01-03 DIAGNOSIS — S199XXA Unspecified injury of neck, initial encounter: Secondary | ICD-10-CM | POA: Diagnosis not present

## 2020-01-03 DIAGNOSIS — R1032 Left lower quadrant pain: Secondary | ICD-10-CM | POA: Diagnosis not present

## 2020-01-03 DIAGNOSIS — M5134 Other intervertebral disc degeneration, thoracic region: Secondary | ICD-10-CM | POA: Diagnosis not present

## 2020-01-30 DIAGNOSIS — G894 Chronic pain syndrome: Secondary | ICD-10-CM | POA: Diagnosis not present

## 2020-01-30 DIAGNOSIS — M170 Bilateral primary osteoarthritis of knee: Secondary | ICD-10-CM | POA: Diagnosis not present

## 2020-01-30 DIAGNOSIS — I10 Essential (primary) hypertension: Secondary | ICD-10-CM | POA: Diagnosis not present

## 2020-02-03 DIAGNOSIS — Z23 Encounter for immunization: Secondary | ICD-10-CM | POA: Diagnosis not present

## 2020-02-17 DIAGNOSIS — M170 Bilateral primary osteoarthritis of knee: Secondary | ICD-10-CM | POA: Diagnosis not present

## 2020-02-17 DIAGNOSIS — Z79899 Other long term (current) drug therapy: Secondary | ICD-10-CM | POA: Diagnosis not present

## 2020-02-17 DIAGNOSIS — G894 Chronic pain syndrome: Secondary | ICD-10-CM | POA: Diagnosis not present

## 2020-02-17 DIAGNOSIS — F419 Anxiety disorder, unspecified: Secondary | ICD-10-CM | POA: Diagnosis not present

## 2020-02-17 DIAGNOSIS — M542 Cervicalgia: Secondary | ICD-10-CM | POA: Diagnosis not present

## 2020-02-17 DIAGNOSIS — R251 Tremor, unspecified: Secondary | ICD-10-CM | POA: Diagnosis not present

## 2020-02-17 DIAGNOSIS — N200 Calculus of kidney: Secondary | ICD-10-CM | POA: Diagnosis not present

## 2020-02-24 DIAGNOSIS — Z23 Encounter for immunization: Secondary | ICD-10-CM | POA: Diagnosis not present

## 2020-03-02 DIAGNOSIS — N200 Calculus of kidney: Secondary | ICD-10-CM | POA: Diagnosis not present

## 2020-04-13 DIAGNOSIS — M170 Bilateral primary osteoarthritis of knee: Secondary | ICD-10-CM | POA: Diagnosis not present

## 2020-04-13 DIAGNOSIS — G894 Chronic pain syndrome: Secondary | ICD-10-CM | POA: Diagnosis not present

## 2020-04-13 DIAGNOSIS — G4700 Insomnia, unspecified: Secondary | ICD-10-CM | POA: Diagnosis not present

## 2020-05-10 IMAGING — CR DG CHEST 2V
2 series · 2 of 2 positions shown · non-contrast
Comparison: Chest radiograph 12/28/2015

CLINICAL DATA: Left-sided chest pain

EXAM:
CHEST - 2 VIEW

[chest pa]
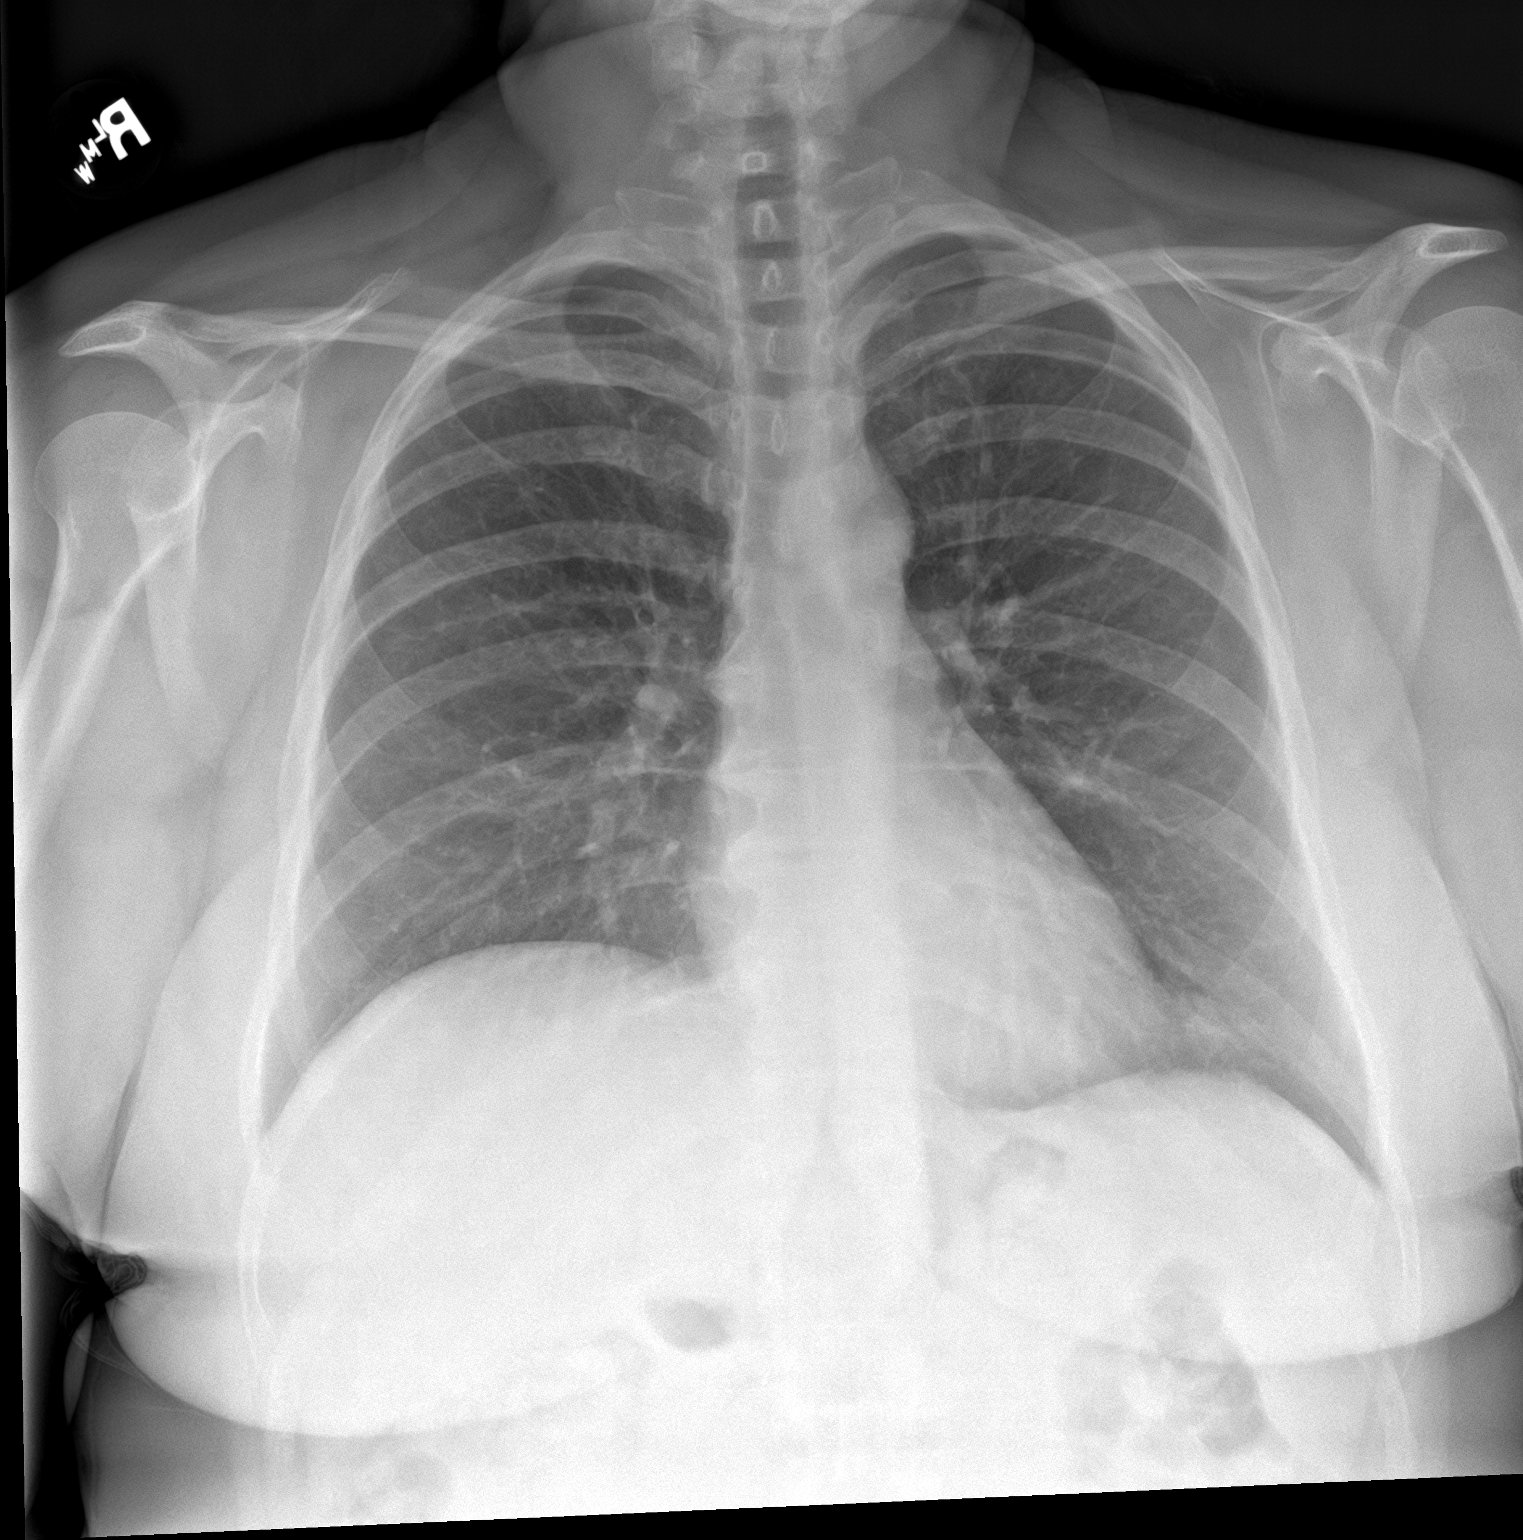

[chest lat]
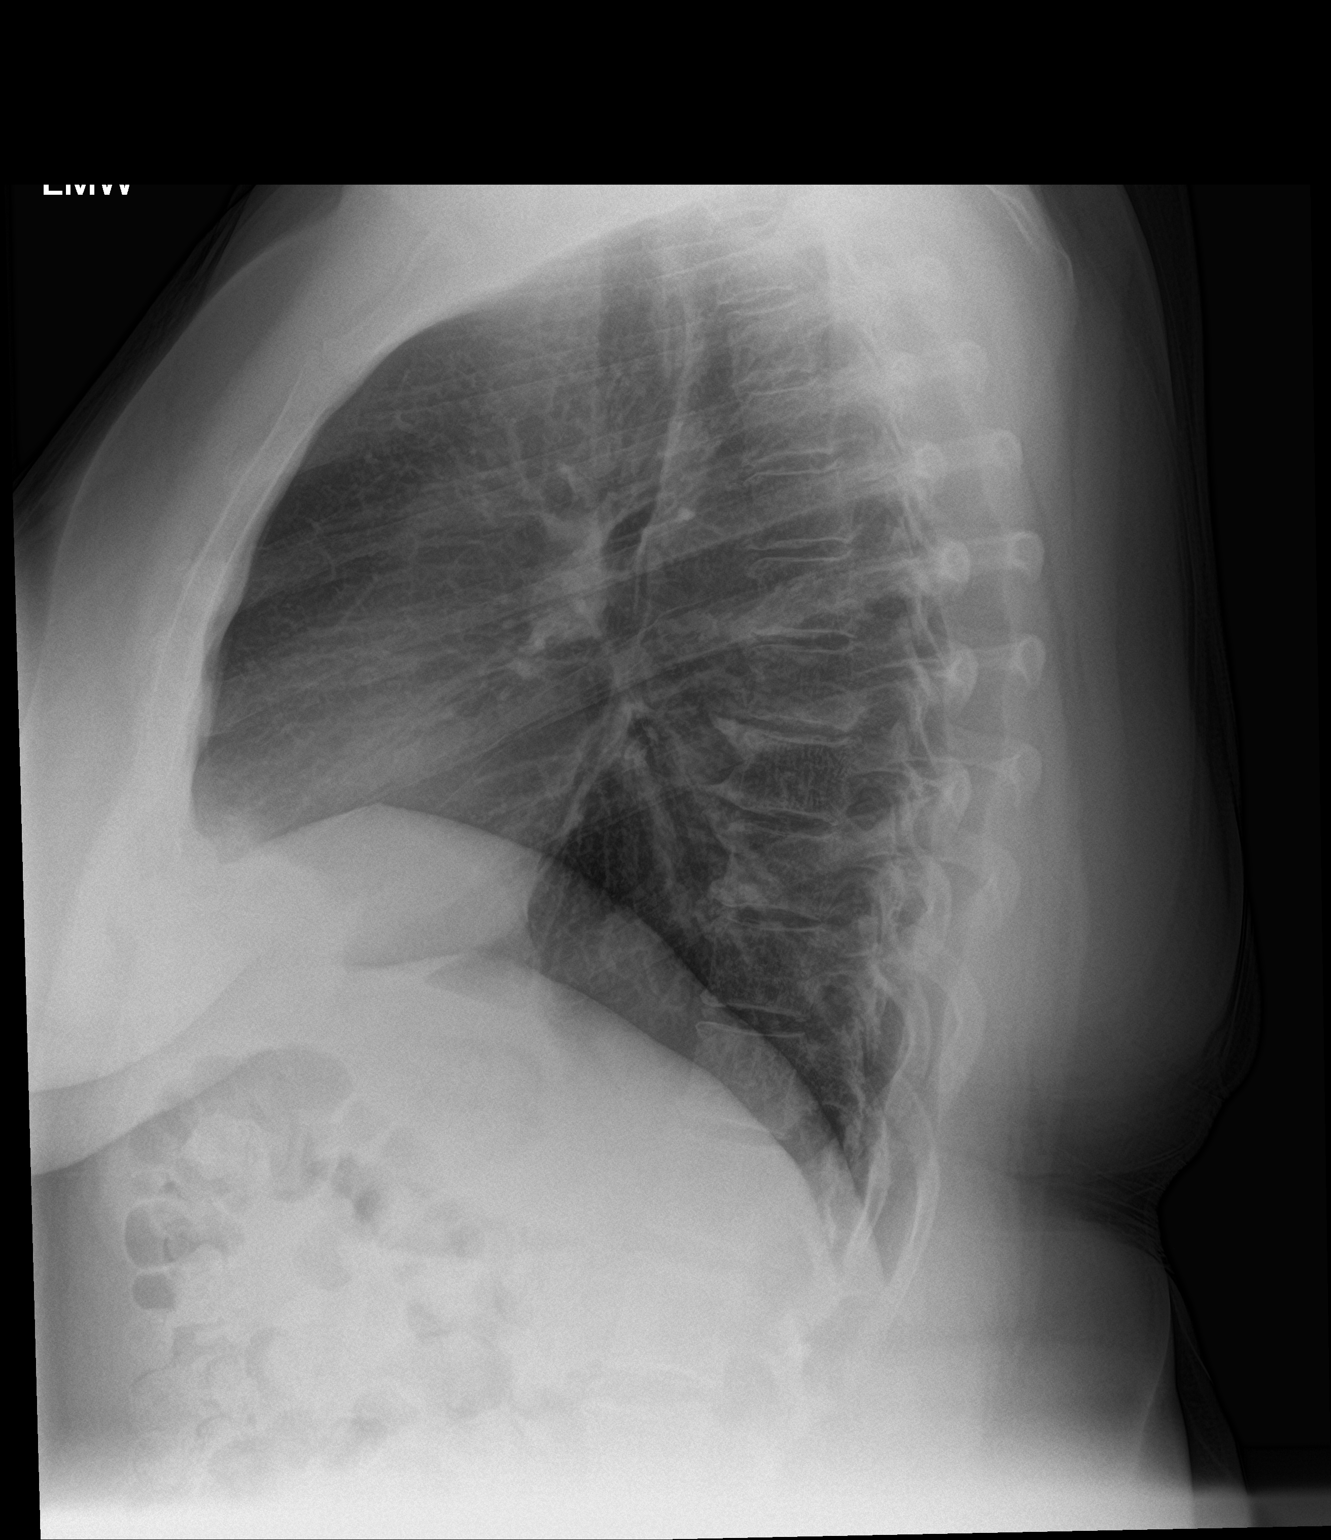

[2 of 2 positions shown; findings below may reference images not displayed]

FINDINGS: The heart size and mediastinal contours are within normal limits.
Both lungs are clear. The visualized skeletal structures are
unremarkable.
IMPRESSION: Chest.

## 2020-05-20 DIAGNOSIS — Z9884 Bariatric surgery status: Secondary | ICD-10-CM | POA: Diagnosis not present

## 2020-05-20 DIAGNOSIS — R110 Nausea: Secondary | ICD-10-CM | POA: Diagnosis not present

## 2020-05-20 DIAGNOSIS — R140 Abdominal distension (gaseous): Secondary | ICD-10-CM | POA: Diagnosis not present

## 2020-06-15 DIAGNOSIS — G894 Chronic pain syndrome: Secondary | ICD-10-CM | POA: Diagnosis not present

## 2020-06-15 DIAGNOSIS — I10 Essential (primary) hypertension: Secondary | ICD-10-CM | POA: Diagnosis not present

## 2020-06-15 DIAGNOSIS — M170 Bilateral primary osteoarthritis of knee: Secondary | ICD-10-CM | POA: Diagnosis not present

## 2020-06-15 DIAGNOSIS — R143 Flatulence: Secondary | ICD-10-CM | POA: Diagnosis not present

## 2020-06-15 DIAGNOSIS — R519 Headache, unspecified: Secondary | ICD-10-CM | POA: Diagnosis not present

## 2020-08-03 DIAGNOSIS — Z1212 Encounter for screening for malignant neoplasm of rectum: Secondary | ICD-10-CM | POA: Diagnosis not present

## 2020-08-03 DIAGNOSIS — R109 Unspecified abdominal pain: Secondary | ICD-10-CM | POA: Diagnosis not present

## 2020-08-03 DIAGNOSIS — R143 Flatulence: Secondary | ICD-10-CM | POA: Diagnosis not present

## 2020-08-03 DIAGNOSIS — Z1211 Encounter for screening for malignant neoplasm of colon: Secondary | ICD-10-CM | POA: Diagnosis not present

## 2020-08-03 DIAGNOSIS — R110 Nausea: Secondary | ICD-10-CM | POA: Diagnosis not present

## 2020-08-03 DIAGNOSIS — K5900 Constipation, unspecified: Secondary | ICD-10-CM | POA: Diagnosis not present

## 2020-08-18 DIAGNOSIS — R140 Abdominal distension (gaseous): Secondary | ICD-10-CM | POA: Diagnosis not present

## 2020-08-18 DIAGNOSIS — R109 Unspecified abdominal pain: Secondary | ICD-10-CM | POA: Diagnosis not present

## 2020-08-31 DIAGNOSIS — R140 Abdominal distension (gaseous): Secondary | ICD-10-CM | POA: Diagnosis not present

## 2020-08-31 DIAGNOSIS — R1031 Right lower quadrant pain: Secondary | ICD-10-CM | POA: Diagnosis not present

## 2020-12-28 DIAGNOSIS — D500 Iron deficiency anemia secondary to blood loss (chronic): Secondary | ICD-10-CM | POA: Diagnosis not present

## 2020-12-28 DIAGNOSIS — U071 COVID-19: Secondary | ICD-10-CM | POA: Diagnosis not present

## 2020-12-31 DIAGNOSIS — M170 Bilateral primary osteoarthritis of knee: Secondary | ICD-10-CM | POA: Diagnosis not present

## 2020-12-31 DIAGNOSIS — U071 COVID-19: Secondary | ICD-10-CM | POA: Diagnosis not present

## 2020-12-31 DIAGNOSIS — R42 Dizziness and giddiness: Secondary | ICD-10-CM | POA: Diagnosis not present

## 2020-12-31 DIAGNOSIS — Z79899 Other long term (current) drug therapy: Secondary | ICD-10-CM | POA: Diagnosis not present

## 2020-12-31 DIAGNOSIS — G894 Chronic pain syndrome: Secondary | ICD-10-CM | POA: Diagnosis not present

## 2020-12-31 DIAGNOSIS — R519 Headache, unspecified: Secondary | ICD-10-CM | POA: Diagnosis not present

## 2021-04-12 ENCOUNTER — Telehealth: Payer: Self-pay

## 2021-04-21 DIAGNOSIS — D500 Iron deficiency anemia secondary to blood loss (chronic): Secondary | ICD-10-CM | POA: Diagnosis not present

## 2021-05-20 DIAGNOSIS — M542 Cervicalgia: Secondary | ICD-10-CM | POA: Diagnosis not present

## 2021-05-20 DIAGNOSIS — K219 Gastro-esophageal reflux disease without esophagitis: Secondary | ICD-10-CM | POA: Diagnosis not present

## 2021-05-20 DIAGNOSIS — K0889 Other specified disorders of teeth and supporting structures: Secondary | ICD-10-CM | POA: Diagnosis not present

## 2021-05-20 DIAGNOSIS — M170 Bilateral primary osteoarthritis of knee: Secondary | ICD-10-CM | POA: Diagnosis not present

## 2021-05-20 DIAGNOSIS — Z79899 Other long term (current) drug therapy: Secondary | ICD-10-CM | POA: Diagnosis not present

## 2021-05-20 DIAGNOSIS — R413 Other amnesia: Secondary | ICD-10-CM | POA: Diagnosis not present

## 2021-05-20 DIAGNOSIS — G894 Chronic pain syndrome: Secondary | ICD-10-CM | POA: Diagnosis not present

## 2021-05-20 DIAGNOSIS — R9431 Abnormal electrocardiogram [ECG] [EKG]: Secondary | ICD-10-CM | POA: Diagnosis not present

## 2021-05-20 DIAGNOSIS — R42 Dizziness and giddiness: Secondary | ICD-10-CM | POA: Diagnosis not present

## 2021-05-20 DIAGNOSIS — I10 Essential (primary) hypertension: Secondary | ICD-10-CM | POA: Diagnosis not present

## 2021-05-20 DIAGNOSIS — F321 Major depressive disorder, single episode, moderate: Secondary | ICD-10-CM | POA: Diagnosis not present

## 2021-06-14 DIAGNOSIS — R509 Fever, unspecified: Secondary | ICD-10-CM | POA: Diagnosis not present

## 2021-06-14 DIAGNOSIS — R413 Other amnesia: Secondary | ICD-10-CM | POA: Diagnosis not present

## 2021-06-14 DIAGNOSIS — R892 Abnormal level of other drugs, medicaments and biological substances in specimens from other organs, systems and tissues: Secondary | ICD-10-CM | POA: Diagnosis not present

## 2021-06-14 DIAGNOSIS — N12 Tubulo-interstitial nephritis, not specified as acute or chronic: Secondary | ICD-10-CM | POA: Diagnosis not present

## 2021-06-14 DIAGNOSIS — Z59819 Housing instability, housed unspecified: Secondary | ICD-10-CM | POA: Diagnosis not present

## 2021-06-18 DIAGNOSIS — M26622 Arthralgia of left temporomandibular joint: Secondary | ICD-10-CM | POA: Diagnosis not present

## 2021-06-18 DIAGNOSIS — R42 Dizziness and giddiness: Secondary | ICD-10-CM | POA: Diagnosis not present

## 2021-06-18 DIAGNOSIS — R9431 Abnormal electrocardiogram [ECG] [EKG]: Secondary | ICD-10-CM | POA: Diagnosis not present

## 2021-06-18 DIAGNOSIS — H6692 Otitis media, unspecified, left ear: Secondary | ICD-10-CM | POA: Diagnosis not present

## 2021-06-28 DIAGNOSIS — F339 Major depressive disorder, recurrent, unspecified: Secondary | ICD-10-CM | POA: Diagnosis not present

## 2021-06-28 DIAGNOSIS — Z1389 Encounter for screening for other disorder: Secondary | ICD-10-CM | POA: Diagnosis not present

## 2021-06-28 DIAGNOSIS — Z79899 Other long term (current) drug therapy: Secondary | ICD-10-CM | POA: Diagnosis not present

## 2021-06-28 DIAGNOSIS — F419 Anxiety disorder, unspecified: Secondary | ICD-10-CM | POA: Diagnosis not present

## 2021-07-01 DIAGNOSIS — R079 Chest pain, unspecified: Secondary | ICD-10-CM | POA: Diagnosis not present

## 2021-07-01 DIAGNOSIS — R931 Abnormal findings on diagnostic imaging of heart and coronary circulation: Secondary | ICD-10-CM | POA: Diagnosis not present

## 2021-07-01 DIAGNOSIS — Z8249 Family history of ischemic heart disease and other diseases of the circulatory system: Secondary | ICD-10-CM | POA: Diagnosis not present

## 2021-07-01 DIAGNOSIS — R42 Dizziness and giddiness: Secondary | ICD-10-CM | POA: Diagnosis not present

## 2021-07-05 DIAGNOSIS — R42 Dizziness and giddiness: Secondary | ICD-10-CM | POA: Diagnosis not present

## 2021-07-05 DIAGNOSIS — R413 Other amnesia: Secondary | ICD-10-CM | POA: Diagnosis not present

## 2021-07-05 DIAGNOSIS — G4700 Insomnia, unspecified: Secondary | ICD-10-CM | POA: Diagnosis not present

## 2021-07-05 DIAGNOSIS — H6692 Otitis media, unspecified, left ear: Secondary | ICD-10-CM | POA: Diagnosis not present

## 2021-07-22 DIAGNOSIS — F419 Anxiety disorder, unspecified: Secondary | ICD-10-CM | POA: Diagnosis not present

## 2021-07-22 DIAGNOSIS — F339 Major depressive disorder, recurrent, unspecified: Secondary | ICD-10-CM | POA: Diagnosis not present

## 2021-07-23 DIAGNOSIS — I519 Heart disease, unspecified: Secondary | ICD-10-CM | POA: Diagnosis not present

## 2021-07-23 DIAGNOSIS — R079 Chest pain, unspecified: Secondary | ICD-10-CM | POA: Diagnosis not present

## 2021-08-16 DIAGNOSIS — G894 Chronic pain syndrome: Secondary | ICD-10-CM | POA: Diagnosis not present

## 2021-08-16 DIAGNOSIS — I10 Essential (primary) hypertension: Secondary | ICD-10-CM | POA: Diagnosis not present

## 2021-08-16 DIAGNOSIS — Z79899 Other long term (current) drug therapy: Secondary | ICD-10-CM | POA: Diagnosis not present

## 2021-08-16 DIAGNOSIS — M170 Bilateral primary osteoarthritis of knee: Secondary | ICD-10-CM | POA: Diagnosis not present

## 2021-08-19 DIAGNOSIS — F339 Major depressive disorder, recurrent, unspecified: Secondary | ICD-10-CM | POA: Diagnosis not present

## 2021-08-19 DIAGNOSIS — F419 Anxiety disorder, unspecified: Secondary | ICD-10-CM | POA: Diagnosis not present

## 2021-10-11 DIAGNOSIS — R42 Dizziness and giddiness: Secondary | ICD-10-CM | POA: Diagnosis not present

## 2021-10-27 DIAGNOSIS — I10 Essential (primary) hypertension: Secondary | ICD-10-CM | POA: Diagnosis not present

## 2021-10-27 DIAGNOSIS — I519 Heart disease, unspecified: Secondary | ICD-10-CM | POA: Diagnosis not present

## 2021-10-27 DIAGNOSIS — I493 Ventricular premature depolarization: Secondary | ICD-10-CM | POA: Diagnosis not present

## 2021-10-27 DIAGNOSIS — D509 Iron deficiency anemia, unspecified: Secondary | ICD-10-CM | POA: Diagnosis not present

## 2021-11-03 DIAGNOSIS — D509 Iron deficiency anemia, unspecified: Secondary | ICD-10-CM | POA: Diagnosis not present

## 2021-11-15 DIAGNOSIS — G894 Chronic pain syndrome: Secondary | ICD-10-CM | POA: Diagnosis not present

## 2021-11-15 DIAGNOSIS — Z79899 Other long term (current) drug therapy: Secondary | ICD-10-CM | POA: Diagnosis not present

## 2021-11-15 DIAGNOSIS — L304 Erythema intertrigo: Secondary | ICD-10-CM | POA: Diagnosis not present

## 2021-11-15 DIAGNOSIS — M5136 Other intervertebral disc degeneration, lumbar region: Secondary | ICD-10-CM | POA: Diagnosis not present

## 2021-11-15 DIAGNOSIS — M170 Bilateral primary osteoarthritis of knee: Secondary | ICD-10-CM | POA: Diagnosis not present

## 2021-11-15 DIAGNOSIS — I10 Essential (primary) hypertension: Secondary | ICD-10-CM | POA: Diagnosis not present

## 2021-11-15 DIAGNOSIS — Z1211 Encounter for screening for malignant neoplasm of colon: Secondary | ICD-10-CM | POA: Diagnosis not present

## 2021-11-18 DIAGNOSIS — F419 Anxiety disorder, unspecified: Secondary | ICD-10-CM | POA: Diagnosis not present

## 2021-11-18 DIAGNOSIS — F339 Major depressive disorder, recurrent, unspecified: Secondary | ICD-10-CM | POA: Diagnosis not present

## 2022-01-06 DIAGNOSIS — F339 Major depressive disorder, recurrent, unspecified: Secondary | ICD-10-CM | POA: Diagnosis not present

## 2022-01-06 DIAGNOSIS — F419 Anxiety disorder, unspecified: Secondary | ICD-10-CM | POA: Diagnosis not present

## 2022-01-07 DIAGNOSIS — R0781 Pleurodynia: Secondary | ICD-10-CM | POA: Diagnosis not present

## 2022-01-07 DIAGNOSIS — S20211A Contusion of right front wall of thorax, initial encounter: Secondary | ICD-10-CM | POA: Diagnosis not present

## 2022-01-07 DIAGNOSIS — S29009A Unspecified injury of muscle and tendon of unspecified wall of thorax, initial encounter: Secondary | ICD-10-CM | POA: Diagnosis not present

## 2022-01-18 DIAGNOSIS — M170 Bilateral primary osteoarthritis of knee: Secondary | ICD-10-CM | POA: Diagnosis not present

## 2022-01-18 DIAGNOSIS — G894 Chronic pain syndrome: Secondary | ICD-10-CM | POA: Diagnosis not present

## 2022-01-18 DIAGNOSIS — M5136 Other intervertebral disc degeneration, lumbar region: Secondary | ICD-10-CM | POA: Diagnosis not present

## 2022-01-18 DIAGNOSIS — M5416 Radiculopathy, lumbar region: Secondary | ICD-10-CM | POA: Diagnosis not present

## 2022-01-18 DIAGNOSIS — M47812 Spondylosis without myelopathy or radiculopathy, cervical region: Secondary | ICD-10-CM | POA: Diagnosis not present

## 2022-02-03 DIAGNOSIS — F419 Anxiety disorder, unspecified: Secondary | ICD-10-CM | POA: Diagnosis not present

## 2022-02-03 DIAGNOSIS — F339 Major depressive disorder, recurrent, unspecified: Secondary | ICD-10-CM | POA: Diagnosis not present

## 2022-02-21 DIAGNOSIS — K5900 Constipation, unspecified: Secondary | ICD-10-CM | POA: Diagnosis not present

## 2022-02-21 DIAGNOSIS — B3731 Acute candidiasis of vulva and vagina: Secondary | ICD-10-CM | POA: Diagnosis not present

## 2022-02-21 DIAGNOSIS — K6389 Other specified diseases of intestine: Secondary | ICD-10-CM | POA: Diagnosis not present

## 2022-02-21 DIAGNOSIS — K580 Irritable bowel syndrome with diarrhea: Secondary | ICD-10-CM | POA: Diagnosis not present

## 2022-03-08 DIAGNOSIS — N938 Other specified abnormal uterine and vaginal bleeding: Secondary | ICD-10-CM | POA: Diagnosis not present

## 2022-03-08 DIAGNOSIS — N83202 Unspecified ovarian cyst, left side: Secondary | ICD-10-CM | POA: Diagnosis not present

## 2022-03-08 DIAGNOSIS — R0789 Other chest pain: Secondary | ICD-10-CM | POA: Diagnosis not present

## 2022-03-08 DIAGNOSIS — R1032 Left lower quadrant pain: Secondary | ICD-10-CM | POA: Diagnosis not present

## 2022-03-08 DIAGNOSIS — R0602 Shortness of breath: Secondary | ICD-10-CM | POA: Diagnosis not present

## 2022-03-08 DIAGNOSIS — N939 Abnormal uterine and vaginal bleeding, unspecified: Secondary | ICD-10-CM | POA: Diagnosis not present

## 2022-03-08 DIAGNOSIS — N83292 Other ovarian cyst, left side: Secondary | ICD-10-CM | POA: Diagnosis not present

## 2022-03-08 DIAGNOSIS — N841 Polyp of cervix uteri: Secondary | ICD-10-CM | POA: Diagnosis not present

## 2022-03-09 DIAGNOSIS — N939 Abnormal uterine and vaginal bleeding, unspecified: Secondary | ICD-10-CM | POA: Diagnosis not present

## 2022-03-10 DIAGNOSIS — N939 Abnormal uterine and vaginal bleeding, unspecified: Secondary | ICD-10-CM | POA: Diagnosis not present

## 2022-03-17 DIAGNOSIS — R202 Paresthesia of skin: Secondary | ICD-10-CM | POA: Diagnosis not present

## 2022-03-17 DIAGNOSIS — G894 Chronic pain syndrome: Secondary | ICD-10-CM | POA: Diagnosis not present

## 2022-03-17 DIAGNOSIS — N939 Abnormal uterine and vaginal bleeding, unspecified: Secondary | ICD-10-CM | POA: Diagnosis not present

## 2022-03-17 DIAGNOSIS — R0600 Dyspnea, unspecified: Secondary | ICD-10-CM | POA: Diagnosis not present

## 2022-03-17 DIAGNOSIS — I10 Essential (primary) hypertension: Secondary | ICD-10-CM | POA: Diagnosis not present

## 2022-03-17 DIAGNOSIS — K1379 Other lesions of oral mucosa: Secondary | ICD-10-CM | POA: Diagnosis not present

## 2022-04-06 DIAGNOSIS — I493 Ventricular premature depolarization: Secondary | ICD-10-CM | POA: Diagnosis not present

## 2022-04-06 DIAGNOSIS — I519 Heart disease, unspecified: Secondary | ICD-10-CM | POA: Diagnosis not present

## 2022-04-06 DIAGNOSIS — I10 Essential (primary) hypertension: Secondary | ICD-10-CM | POA: Diagnosis not present

## 2022-04-06 DIAGNOSIS — D509 Iron deficiency anemia, unspecified: Secondary | ICD-10-CM | POA: Diagnosis not present

## 2022-04-14 DIAGNOSIS — N83209 Unspecified ovarian cyst, unspecified side: Secondary | ICD-10-CM | POA: Diagnosis not present

## 2022-04-14 DIAGNOSIS — N939 Abnormal uterine and vaginal bleeding, unspecified: Secondary | ICD-10-CM | POA: Diagnosis not present

## 2022-04-14 DIAGNOSIS — N72 Inflammatory disease of cervix uteri: Secondary | ICD-10-CM | POA: Diagnosis not present

## 2022-04-14 DIAGNOSIS — N83292 Other ovarian cyst, left side: Secondary | ICD-10-CM | POA: Diagnosis not present

## 2022-05-12 DIAGNOSIS — G4700 Insomnia, unspecified: Secondary | ICD-10-CM | POA: Diagnosis not present

## 2022-05-12 DIAGNOSIS — G894 Chronic pain syndrome: Secondary | ICD-10-CM | POA: Diagnosis not present

## 2022-05-12 DIAGNOSIS — J301 Allergic rhinitis due to pollen: Secondary | ICD-10-CM | POA: Diagnosis not present

## 2022-07-19 ENCOUNTER — Emergency Department
Admission: EM | Admit: 2022-07-19 | Discharge: 2022-07-19 | Disposition: A | Discharge status: Other Acute Inpatient Hospital | Payer: Medicaid Other | Attending: Emergency Medicine | Admitting: Emergency Medicine

## 2022-07-19 ENCOUNTER — Encounter: Payer: Self-pay | Admitting: Emergency Medicine

## 2022-07-19 ENCOUNTER — Inpatient Hospital Stay (HOSPITAL_COMMUNITY)
Admission: EM | Admit: 2022-07-19 | Discharge: 2022-07-26 | DRG: 336 | Disposition: A | Discharge status: Home / Self Care | Payer: Medicaid Other | Source: Other Acute Inpatient Hospital | Attending: General Surgery | Admitting: General Surgery

## 2022-07-19 ENCOUNTER — Encounter (HOSPITAL_COMMUNITY): Payer: Self-pay

## 2022-07-19 ENCOUNTER — Emergency Department: Payer: Medicaid Other

## 2022-07-19 ENCOUNTER — Other Ambulatory Visit: Payer: Self-pay

## 2022-07-19 DIAGNOSIS — R1084 Generalized abdominal pain: Secondary | ICD-10-CM

## 2022-07-19 DIAGNOSIS — K529 Noninfective gastroenteritis and colitis, unspecified: Secondary | ICD-10-CM | POA: Diagnosis present

## 2022-07-19 DIAGNOSIS — Z79899 Other long term (current) drug therapy: Secondary | ICD-10-CM

## 2022-07-19 DIAGNOSIS — G894 Chronic pain syndrome: Secondary | ICD-10-CM | POA: Diagnosis present

## 2022-07-19 DIAGNOSIS — I10 Essential (primary) hypertension: Secondary | ICD-10-CM | POA: Diagnosis present

## 2022-07-19 DIAGNOSIS — K458 Other specified abdominal hernia without obstruction or gangrene: Secondary | ICD-10-CM | POA: Diagnosis present

## 2022-07-19 DIAGNOSIS — Z9884 Bariatric surgery status: Secondary | ICD-10-CM

## 2022-07-19 DIAGNOSIS — K9189 Other postprocedural complications and disorders of digestive system: Secondary | ICD-10-CM | POA: Diagnosis not present

## 2022-07-19 DIAGNOSIS — Z888 Allergy status to other drugs, medicaments and biological substances status: Secondary | ICD-10-CM

## 2022-07-19 DIAGNOSIS — K9589 Other complications of other bariatric procedure: Secondary | ICD-10-CM | POA: Diagnosis present

## 2022-07-19 DIAGNOSIS — D509 Iron deficiency anemia, unspecified: Secondary | ICD-10-CM | POA: Diagnosis present

## 2022-07-19 DIAGNOSIS — Z87442 Personal history of urinary calculi: Secondary | ICD-10-CM

## 2022-07-19 DIAGNOSIS — K660 Peritoneal adhesions (postprocedural) (postinfection): Secondary | ICD-10-CM | POA: Diagnosis present

## 2022-07-19 DIAGNOSIS — Z1152 Encounter for screening for COVID-19: Secondary | ICD-10-CM | POA: Diagnosis not present

## 2022-07-19 DIAGNOSIS — R519 Headache, unspecified: Secondary | ICD-10-CM | POA: Diagnosis present

## 2022-07-19 DIAGNOSIS — X500XXA Overexertion from strenuous movement or load, initial encounter: Secondary | ICD-10-CM

## 2022-07-19 DIAGNOSIS — K6389 Other specified diseases of intestine: Secondary | ICD-10-CM | POA: Diagnosis not present

## 2022-07-19 DIAGNOSIS — K56609 Unspecified intestinal obstruction, unspecified as to partial versus complete obstruction: Secondary | ICD-10-CM | POA: Diagnosis present

## 2022-07-19 DIAGNOSIS — Y838 Other surgical procedures as the cause of abnormal reaction of the patient, or of later complication, without mention of misadventure at the time of the procedure: Secondary | ICD-10-CM | POA: Diagnosis present

## 2022-07-19 DIAGNOSIS — Z885 Allergy status to narcotic agent status: Secondary | ICD-10-CM

## 2022-07-19 DIAGNOSIS — Z8249 Family history of ischemic heart disease and other diseases of the circulatory system: Secondary | ICD-10-CM

## 2022-07-19 DIAGNOSIS — Z841 Family history of disorders of kidney and ureter: Secondary | ICD-10-CM

## 2022-07-19 DIAGNOSIS — K567 Ileus, unspecified: Secondary | ICD-10-CM | POA: Diagnosis not present

## 2022-07-19 DIAGNOSIS — Z823 Family history of stroke: Secondary | ICD-10-CM

## 2022-07-19 DIAGNOSIS — R110 Nausea: Secondary | ICD-10-CM | POA: Diagnosis not present

## 2022-07-19 DIAGNOSIS — R1111 Vomiting without nausea: Secondary | ICD-10-CM | POA: Diagnosis not present

## 2022-07-19 DIAGNOSIS — K460 Unspecified abdominal hernia with obstruction, without gangrene: Principal | ICD-10-CM | POA: Diagnosis present

## 2022-07-19 DIAGNOSIS — R5381 Other malaise: Secondary | ICD-10-CM | POA: Diagnosis not present

## 2022-07-19 DIAGNOSIS — N200 Calculus of kidney: Secondary | ICD-10-CM | POA: Diagnosis not present

## 2022-07-19 DIAGNOSIS — R5383 Other fatigue: Secondary | ICD-10-CM | POA: Diagnosis not present

## 2022-07-19 LAB — COMPREHENSIVE METABOLIC PANEL
ALT: 20 U/L (ref 0–44)
AST: 24 U/L (ref 15–41)
Albumin: 3.7 g/dL (ref 3.5–5.0)
Alkaline Phosphatase: 109 U/L (ref 38–126)
Anion gap: 6 (ref 5–15)
BUN: 19 mg/dL (ref 6–20)
CO2: 25 mmol/L (ref 22–32)
Calcium: 8.9 mg/dL (ref 8.9–10.3)
Chloride: 106 mmol/L (ref 98–111)
Creatinine, Ser: 0.82 mg/dL (ref 0.44–1.00)
GFR, Estimated: >60 mL/min (ref >60)
Glucose, Bld: 120 mg/dL — ABNORMAL HIGH (ref 70–99)
Potassium: 4.2 mmol/L (ref 3.5–5.1)
Sodium: 137 mmol/L (ref 135–145)
Total Bilirubin: 0.7 mg/dL (ref 0.3–1.2)
Total Protein: 7.2 g/dL (ref 6.5–8.1)

## 2022-07-19 LAB — RESP PANEL BY RT-PCR (RSV, FLU A&B, COVID)  RVPGX2
Influenza A by PCR: NEGATIVE
Influenza B by PCR: NEGATIVE
Resp Syncytial Virus by PCR: NEGATIVE
SARS Coronavirus 2 by RT PCR: NEGATIVE

## 2022-07-19 LAB — CBC
HCT: 41.6 % (ref 36.0–46.0)
Hemoglobin: 13.4 g/dL (ref 12.0–15.0)
MCH: 28.3 pg (ref 26.0–34.0)
MCHC: 32.2 g/dL (ref 30.0–36.0)
MCV: 87.9 fL (ref 80.0–100.0)
Platelets: 278 10*3/uL (ref 150–400)
RBC: 4.73 MIL/uL (ref 3.87–5.11)
RDW: 14.3 % (ref 11.5–15.5)
WBC: 5.4 10*3/uL (ref 4.0–10.5)
nRBC: 0 % (ref 0.0–0.2)

## 2022-07-19 LAB — LIPASE, BLOOD: Lipase: 33 U/L (ref 11–51)

## 2022-07-19 LAB — GROUP A STREP BY PCR: Group A Strep by PCR: NOT DETECTED

## 2022-07-19 LAB — TROPONIN I (HIGH SENSITIVITY): Troponin I (High Sensitivity): 3 ng/L (ref <18)

## 2022-07-19 LAB — LACTIC ACID, PLASMA: Lactic Acid, Venous: 1 mmol/L (ref 0.5–1.9)

## 2022-07-19 MED ORDER — ONDANSETRON HCL 4 MG/2ML IJ SOLN: 4.0000 mg | Freq: Four times a day (QID) | INTRAMUSCULAR | Status: DC | PRN

## 2022-07-19 MED ORDER — DIPHENHYDRAMINE HCL 50 MG/ML IJ SOLN
25.0000 mg | Freq: Once | INTRAMUSCULAR | Status: AC
  Administered 2022-07-19: 25 mg via INTRAVENOUS
  Filled 2022-07-19: qty 1

## 2022-07-19 MED ORDER — PANTOPRAZOLE SODIUM 40 MG IV SOLR: 40.0000 mg | Freq: Every day | INTRAVENOUS | Status: DC

## 2022-07-19 MED ORDER — METOCLOPRAMIDE HCL 5 MG/ML IJ SOLN
10.0000 mg | Freq: Once | INTRAMUSCULAR | Status: AC
  Administered 2022-07-20: 10 mg via INTRAVENOUS
  Filled 2022-07-19: qty 2

## 2022-07-19 MED ORDER — HYDROMORPHONE HCL 1 MG/ML IJ SOLN
0.5000 mg | Freq: Once | INTRAMUSCULAR | Status: AC
  Administered 2022-07-19: 0.5 mg via INTRAVENOUS
  Filled 2022-07-19: qty 0.5

## 2022-07-19 MED ORDER — METHOCARBAMOL 500 MG PO TABS: 500.0000 mg | ORAL_TABLET | Freq: Three times a day (TID) | ORAL | Status: DC | PRN

## 2022-07-19 MED ORDER — DIPHENHYDRAMINE HCL 50 MG/ML IJ SOLN: 25.0000 mg | Freq: Four times a day (QID) | INTRAMUSCULAR | Status: DC | PRN

## 2022-07-19 MED ORDER — PROCHLORPERAZINE EDISYLATE 10 MG/2ML IJ SOLN
10.0000 mg | Freq: Once | INTRAMUSCULAR | Status: AC
  Administered 2022-07-19: 10 mg via INTRAVENOUS
  Filled 2022-07-19: qty 2

## 2022-07-19 MED ORDER — IOHEXOL 350 MG/ML SOLN
100.0000 mL | Freq: Once | INTRAVENOUS | Status: AC | PRN
  Administered 2022-07-19: 100 mL via INTRAVENOUS

## 2022-07-19 MED ORDER — LIDOCAINE VISCOUS HCL 2 % MT SOLN
15.0000 mL | Freq: Once | OROMUCOSAL | Status: AC
  Administered 2022-07-19: 15 mL via OROMUCOSAL
  Filled 2022-07-19: qty 15

## 2022-07-19 MED ORDER — ONDANSETRON 4 MG PO TBDP: 4.0000 mg | ORAL_TABLET | Freq: Four times a day (QID) | ORAL | Status: DC | PRN

## 2022-07-19 MED ORDER — HYDROMORPHONE HCL 1 MG/ML IJ SOLN
0.5000 mg | INTRAMUSCULAR | Status: DC | PRN
  Administered 2022-07-19: 0.5 mg via INTRAVENOUS
  Filled 2022-07-19: qty 0.5

## 2022-07-19 MED ORDER — ENOXAPARIN SODIUM 40 MG/0.4ML IJ SOSY: 40.0000 mg | PREFILLED_SYRINGE | INTRAMUSCULAR | Status: DC

## 2022-07-19 MED ORDER — SODIUM CHLORIDE 0.9 % IV BOLUS
1000.0000 mL | Freq: Once | INTRAVENOUS | Status: AC
  Administered 2022-07-19: 1000 mL via INTRAVENOUS

## 2022-07-19 MED ORDER — ONDANSETRON HCL 4 MG/2ML IJ SOLN: 4.0000 mg | Freq: Once | INTRAMUSCULAR | Status: DC

## 2022-07-19 MED ORDER — METHOCARBAMOL 1000 MG/10ML IJ SOLN
500.0000 mg | Freq: Three times a day (TID) | INTRAVENOUS | Status: DC | PRN
  Filled 2022-07-19: qty 5

## 2022-07-19 MED ORDER — SODIUM CHLORIDE 0.9 % IV SOLN: INTRAVENOUS | Status: DC

## 2022-07-19 MED ORDER — DIPHENHYDRAMINE HCL 25 MG PO CAPS: 25.0000 mg | ORAL_CAPSULE | Freq: Four times a day (QID) | ORAL | Status: DC | PRN

## 2022-07-19 NOTE — ED Triage Notes (Signed)
Patient is a transfer from [place], is being seen by bariatric surgery for possible complications

## 2022-07-19 NOTE — ED Provider Notes (Signed)
Medical screening examination/treatment/procedure(s) were conducted as a shared visit with non-physician practitioner(s) and myself.  I personally evaluated the patient during the encounter.    ----------------------------------------- 8:41 PM on 07/19/2022 ----------------------------------------- Patient resting comfortably reports pain well-controlled this time.  She is alert.  Understanding of the plan for transfer to [facility] emergency department.  Confirmed bariatric surgery services available at [facility] long will see and evaluate the patient there.  Patient accepted in ER to ER transfer  Patient agreeable with plan for transfer   Delman Kitten, MD 07/19/22 2042

## 2022-07-19 NOTE — ED Provider Notes (Signed)
Patient placed on telemetry monitoring and noted by nursing to be in bigeminy.  EKG performed inter by me at 2155 heart rate 80 QRS 110 QTc 430.  Ventricular bigeminy is present.  Consideration this may be a reflex possibly to NG tube insertion.  She is not currently receiving any active further infusions or medications at this time.  Patient is hemodynamically stable.  Will be continue to monitor on telemetry at this time. Patient alert, no chest pain or symptoms associated.    Delman Kitten, MD 07/19/22 2208

## 2022-07-19 NOTE — ED Provider Notes (Signed)
[place] EMERGENCY DEPARTMENT AT [facility] Provider Note   CSN: [id number] Arrival date & time: 07/19/22  2331     History {Add pertinent medical, surgical, social history, OB history to HPI:1} Chief Complaint  Patient presents with   Abdominal Pain    Paula Mccormick is a 52 y.o. female.  The history is provided by the patient and medical records.  Abdominal Pain Paula Mccormick is a 52 y.o. female who presents to the Emergency Department complaining of abdominal pain.  She presents to the emergency department via transfer for evaluation of abdominal pain.  She has a history of prior Roux-en-Y gastric bypass and developed acute abdominal pain and headache with associated episode of emesis and presented to [facility].  She had a CTA head as well as CT abdomen pelvis performed.  Imaging was concerning for adhesive band or stricture in patient's Roux limb concerning for obstruction.  Patient does have partial improvement in her symptoms after NG tube and pain medication administration.  She does have some ongoing headache as well as abdominal pain.  She was feeling well prior to these symptoms occurring.     Home Medications Prior to Admission medications   Medication Sig Start Date End Date Taking? Authorizing Provider  metoprolol succinate (TOPROL-XL) 25 MG 24 hr tablet Take 1 tablet (25 mg total) by mouth daily. 12/30/15   Henreitta Leber, MD  omeprazole (PRILOSEC) 40 MG capsule Take 1 capsule (40 mg total) by mouth daily. 12/30/15   Henreitta Leber, MD      Allergies    Hydrochlorothiazide, Lisinopril, Morphine and related, Other, and Morphine    Review of Systems   Review of Systems  Gastrointestinal:  Positive for abdominal pain.  All other systems reviewed and are negative.   Physical Exam Updated Vital Signs BP 124/69 (BP Location: Left Arm)   Pulse (!) 37   Temp 98.2 F (36.8 C) (Oral)   Ht 5\' 3"  (1.6 m)   Wt 95.3 kg   LMP  01/16/2018   SpO2 95%   BMI 37.22 kg/m  Physical Exam Vitals and nursing note reviewed.  Constitutional:      Appearance: She is well-developed.  HENT:     Head: Normocephalic and atraumatic.  Cardiovascular:     Rate and Rhythm: Regular rhythm. Bradycardia present.     Heart sounds: No murmur heard. Pulmonary:     Effort: Pulmonary effort is normal. No respiratory distress.     Breath sounds: Normal breath sounds.  Abdominal:     Palpations: Abdomen is soft.     Tenderness: There is no guarding or rebound.     Comments: Mild generalized abdominal tenderness  Musculoskeletal:        General: No tenderness.  Skin:    General: Skin is warm and dry.  Neurological:     Mental Status: She is alert and oriented to person, place, and time.  Psychiatric:        Behavior: Behavior normal.     ED Results / Procedures / Treatments   Labs (all labs ordered are listed, but only abnormal results are displayed) Labs Reviewed - No data to display  EKG None  Radiology DG Abdomen 1 View  Result Date: 07/19/2022 CLINICAL DATA:  Malaise and fatigue. EXAM: ABDOMEN - 1 VIEW COMPARISON:  Abdomen and pelvis CT, dated July 19, 2022 FINDINGS: A nasogastric tube is seen with its distal tip overlying the lateral aspect of the left upper quadrant.  Left upper quadrant surgical sutures are also noted. Multiple dilated loops of air-filled small bowel are seen within the left upper quadrant and mid right abdomen. This corresponds to the findings seen on the earlier abdomen and pelvis CT. Radiopaque contrast is seen within the renal collecting systems and urinary bladder. IMPRESSION: 1. Findings consistent with a small-bowel obstruction. 2. Nasogastric tube positioning, as described above. Electronically Signed   By: Virgina Norfolk M.D.   On: 07/19/2022 22:23   CT Angio Head W or Wo Contrast  Result Date: 07/19/2022 CLINICAL DATA:  Headache, sudden, severe. EXAM: CT HEAD WITHOUT CONTRAST CT  ANGIOGRAPHY OF THE HEAD TECHNIQUE: Contiguous axial images were obtained from the base of the skull through the vertex without intravenous contrast. Multidetector CT imaging of the head was performed using the standard protocol during bolus administration of intravenous contrast. 3D post processing, including multiplanar CT image reconstructions and MIPs were obtained to evaluate the vascular anatomy. RADIATION DOSE REDUCTION: This exam was performed according to the departmental dose-optimization program which includes automated exposure control, adjustment of the mA and/or kV according to patient size and/or use of iterative reconstruction technique. CONTRAST:  165mL OMNIPAQUE IOHEXOL 350 MG/ML SOLN COMPARISON:  Head CT 04/02/2004. FINDINGS: CT HEAD Brain: No acute hemorrhage, mass effect or midline shift. Gray-white differentiation is preserved. No hydrocephalus. No extra-axial collection. Basilar cisterns are patent. Vascular: No hyperdense vessel or unexpected calcification. Skull: No calvarial fracture or suspicious bone lesion. Skull base is unremarkable. Sinuses/Orbits: Unremarkable. CTA HEAD Anterior circulation: Intracranial ICAs are patent without stenosis or aneurysm. The proximal ACAs and MCAs are patent without stenosis or aneurysm. Distal branches are symmetric. Posterior circulation: Normal basilar artery. The SCAs, AICAs and PICAs are patent proximally. The PCAs are patent proximally without stenosis or aneurysm. Distal branches are symmetric. Venous sinuses: Patent. Anatomic variants: Hypoplastic left A1 segment. IMPRESSION: 1. No acute intracranial abnormality. 2. No large vessel occlusion, hemodynamically significant stenosis, or aneurysm of the head vessels. Electronically Signed   By: Emmit Alexanders M.D.   On: 07/19/2022 18:47   CT ABDOMEN PELVIS W CONTRAST  Result Date: 07/19/2022 CLINICAL DATA:  Abdominal pain. Postop hysterectomy. EXAM: CT ABDOMEN AND PELVIS WITH CONTRAST TECHNIQUE:  Multidetector CT imaging of the abdomen and pelvis was performed using the standard protocol following bolus administration of intravenous contrast. RADIATION DOSE REDUCTION: This exam was performed according to the departmental dose-optimization program which includes automated exposure control, adjustment of the mA and/or kV according to patient size and/or use of iterative reconstruction technique. CONTRAST:  186mL OMNIPAQUE IOHEXOL 350 MG/ML SOLN COMPARISON:  CT 01/19/2012 FINDINGS: Lower chest: Clear lung bases. Hepatobiliary: Focal fatty infiltration adjacent to the falciform ligament. No suspicious liver lesion. Cholecystectomy. Common bile duct measures 13 mm, normal tapering to the duodenum. There is no visualized choledocholithiasis. Pancreas: No ductal dilatation or inflammation. Spleen: Normal in size without focal abnormality. Adrenals/Urinary Tract: 8 x 15 mm right adrenal nodule, Hounsfield units of 88. No left adrenal nodule. No hydronephrosis. Two nonobstructing stones in the lower pole of the left kidney, larger measuring 4 mm homogeneous renal enhancement with symmetric excretion on delayed phase imaging. Unremarkable urinary bladder. Stomach/Bowel: Gastric bypass anatomy. Small hiatal hernia. The excluded gastric remnant is decompressed. The Roux limb is tortuous, markedly dilated with fecalization of contents with transition point involving a short segment of the Roux limb proximal to the jejunal anastomosis, series 3, image 44 and series 6, image 28. The jejunal anastomosis appears patulous. There is no other small  bowel dilatation. The appendix is normal. Moderate volume of colonic stool. No definite colonic inflammation. Vascular/Lymphatic: No acute vascular findings. Normal caliber abdominal aorta. Patent portal, splenic and mesenteric veins. No bulky abdominopelvic adenopathy. Reproductive: Hysterectomy. Small to moderate volume of non organized free fluid in the dependent pelvis, no  peripherally enhancing or drainable collection. The ovaries are not definitively seen, no evidence of adnexal mass. Other: Pelvic free fluid as described. No free intra-abdominal air. There is trace free fluid within the central upper abdomen. Musculoskeletal: T8 vertebral body hemangioma. Transitional lumbosacral anatomy. There are no acute or suspicious osseous abnormalities. IMPRESSION: 1. Gastric bypass anatomy. The Roux limb is tortuous, markedly dilated with fecalization of contents. Transition point involving a short segment of the Roux limb a few cm proximal to the jejunal anastomosis, may be due to an adhesive band or stricture, but appears to be obstructed. Recommend surgical consultation. 2. Small to moderate volume of non organized free fluid in the dependent pelvis, no peripherally enhancing or drainable collection after recent hysterectomy. 3. Nonobstructing left nephrolithiasis. 4. Right adrenal nodule measuring 8 x 15 mm is indeterminate. This was described on prior outside CT from 2022, reportedly unchanged in size suggesting a benign adenoma. Electronically Signed   By: Keith Rake M.D.   On: 07/19/2022 18:11    Procedures Procedures  {Document cardiac monitor, telemetry assessment procedure when appropriate:1}  Medications Ordered in ED Medications - No data to display  ED Course/ Medical Decision Making/ A&P   {   Click here for ABCD2, HEART and other calculatorsREFRESH Note before signing :1}                          Medical Decision Making  ***  {Document critical care time when appropriate:1} {Document review of labs and clinical decision tools ie heart score, Chads2Vasc2 etc:1}  {Document your independent review of radiology images, and any outside records:1} {Document your discussion with family members, caretakers, and with consultants:1} {Document social determinants of health affecting pt's care:1} {Document your decision making why or why not admission,  treatments were needed:1} Final Clinical Impression(s) / ED Diagnoses Final diagnoses:  None    Rx / DC Orders ED Discharge Orders     None

## 2022-07-19 NOTE — ED Triage Notes (Signed)
Per ems from home for abd pain after moving furniture yesterday.  Periumbilical.  Also worsening headache since last night. Had hysterectomy about a month ago.  20 right ac.  Vomited on way here and was given 4 mg zofran.  Bigeminy on EKG.  Some dizziness/sob.  Vss.  Fsbs 141

## 2022-07-19 NOTE — ED Triage Notes (Signed)
Patient to ED via [facility] from home for abd pain. Patient states she was helping move furniture last night and heard a "pop". Pain since and also in head. Vomiting noted. Hysterectomy month or two ago per pt.

## 2022-07-19 NOTE — H&P (Incomplete)
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 Narda Bonds at [facility] 04/03/2019 (100cm alimentary antecolic roux, [id number] BP limb).  She presented to the emergency department at [facility] this afternoon with abdominal pain that began after moving furniture the day prior.  This is periumbilical.  Associated with 1 episode of emesis, en route.  Also noted a worsening headache with some photophobia.  She was noted to have fairly diffuse abdominal tenderness without peritoneal signs, normal vital signs and normal labs including WBC of 5.4, creatinine of 0.82, normal bicarb, normal lactate.  She underwent a CT angio of her head which was negative and a CT of her abdomen and pelvis confirming gastric bypass anatomy with a tortuous Roux limb, markedly dilated with fecalization of contents and a transition point involving a short segment of the Roux limb just proximal to the jejunojejunostomy.  Small to moderate volume of simple free fluid in the dependent pelvis without peripheral enhancement or drainable collection after recent hysterectomy, nonobstructing left kidney stone and a small indeterminate left adrenal nodule which is unchanged compared to CT 2 years ago.  I have reviewed the images and report personally. Prior Cts since her bypass (reports only) reviewed; do not note any evidence of obstruction.   On review of prior records in [facility], she has been followed at [facility] for chronic diarrhea, bloating and distention worse after eating, by GI, since her bypass. Reports regular daily bowel movements and prior nutritional labs have been normal reportedly. Had an unrevealing EGD last year. She was diagnosed with bacterial overgrowth syndrome and treated at last visit in  October, and had a sitz marker study ordered. SBFT was considered.   An attempt was made by the EDP to transfer the patient to [facility] where her GI and surgeon are, but they are on diversion.     Allergies  Allergen Reactions   Hydrochlorothiazide Other (See Comments)    Severe dehydration Severe dehydration    Lisinopril Other (See Comments)    Other reaction(s): Other (See Comments)   Morphine And Related Hives   Other Hives   Morphine Itching and Rash    Past Medical History:  Diagnosis Date   Essential hypertension    Kidney stones     Past Surgical History:  Procedure Laterality Date   ABDOMINAL HYSTERECTOMY     GALLBLADDER SURGERY     KNEE ARTHROSCOPY      Family History  Problem Relation Age of Onset   Kidney failure Father        on dialysis   Heart attack Sister    Stroke Brother    Chronic Renal Failure Mother     Social History   Socioeconomic History   Marital status: Married    Spouse name: Not on file   Number of children: Not on file   Years of education: Not on file   Highest education level: Not on file  Occupational History   Not on file  Tobacco Use   Smoking status: Never   Smokeless tobacco: Never  Substance and Sexual Activity   Alcohol use: No   Drug use: No   Sexual activity: Yes    Birth control/protection: None  Other Topics Concern   Not on file  Social History Narrative   Not on file   Social Determinants  of Health   Financial Resource Strain: Not on file  Food Insecurity: Not on file  Transportation Needs: Not on file  Physical Activity: Not on file  Stress: Not on file  Social Connections: Not on file    No current facility-administered medications on file prior to encounter.   Current Outpatient Medications on File Prior to Encounter  Medication Sig Dispense Refill   metoprolol succinate (TOPROL-XL) 25 MG 24 hr tablet Take 1 tablet (25 mg total) by mouth daily. 30 tablet 1   omeprazole (PRILOSEC) 40 MG capsule  Take 1 capsule (40 mg total) by mouth daily. 30 capsule 0    Review of Systems: a complete, 10pt review of systems was completed with pertinent positives and negatives as documented in the HPI  Physical Exam: Vitals:   07/19/22 1524  BP: 122/86  Pulse: 83  Resp: 18  Temp: 97.8 F (36.6 C)  SpO2: 95%   Gen: A&Ox3, no distress  Eyes: lids and conjunctivae normal, no icterus. Pupils equally round and reactive to light.  Neck: supple without mass or thyromegaly Chest: respiratory effort is normal. No crepitus or tenderness on palpation of the chest. Breath sounds equal.  Cardiovascular: RRR with palpable distal pulses, no pedal edema*** Gastrointestinal: ***soft, nondistended, nontender. No mass, hepatomegaly or splenomegaly. No hernia. Lymphatic: no lymphadenopathy in the neck or groin Muscoloskeletal: no clubbing or cyanosis of the fingers.  Strength is symmetrical throughout.  Range of motion of bilateral upper and lower extremities normal without pain, crepitation or contracture. Neuro: cranial nerves grossly intact.  Sensation intact to light touch diffusely. Psych: appropriate mood and affect, normal insight/judgment intact  Skin: warm and dry      Latest Ref Rng & Units 07/19/2022    3:24 PM 01/16/2018    8:27 PM 12/28/2015   11:47 PM  CBC  WBC 4.0 - 10.5 K/uL 5.4  4.6  11.6   Hemoglobin 12.0 - 15.0 g/dL 13.4  14.9  16.3   Hematocrit 36.0 - 46.0 % 41.6  42.5  48.5   Platelets 150 - 400 K/uL 278  298  322        Latest Ref Rng & Units 07/19/2022    3:24 PM 01/16/2018    8:27 PM 12/30/2015    3:06 AM  CMP  Glucose 70 - 99 mg/dL 120  97  93   BUN 6 - 20 mg/dL 19  19  24    Creatinine 0.44 - 1.00 mg/dL 0.82  0.82  1.19   Sodium 135 - 145 mmol/L 137  138  139   Potassium 3.5 - 5.1 mmol/L 4.2  3.9  4.0   Chloride 98 - 111 mmol/L 106  108  111   CO2 22 - 32 mmol/L 25  24  25    Calcium 8.9 - 10.3 mg/dL 8.9  8.8  7.9   Total Protein 6.5 - 8.1 g/dL 7.2  7.6    Total Bilirubin  0.3 - 1.2 mg/dL 0.7  0.7    Alkaline Phos 38 - 126 U/L 109  91    AST 15 - 41 U/L 24  19    ALT 0 - 44 U/L 20  16      No results found for: "INR", "PROTIME"  Imaging: CT Angio Head W or Wo Contrast  Result Date: 07/19/2022 CLINICAL DATA:  Headache, sudden, severe. EXAM: CT HEAD WITHOUT CONTRAST CT ANGIOGRAPHY OF THE HEAD TECHNIQUE: Contiguous axial images were obtained from the base of the skull through  the vertex without intravenous contrast. Multidetector CT imaging of the head was performed using the standard protocol during bolus administration of intravenous contrast. 3D post processing, including multiplanar CT image reconstructions and MIPs were obtained to evaluate the vascular anatomy. RADIATION DOSE REDUCTION: This exam was performed according to the departmental dose-optimization program which includes automated exposure control, adjustment of the mA and/or kV according to patient size and/or use of iterative reconstruction technique. CONTRAST:  135mL OMNIPAQUE IOHEXOL 350 MG/ML SOLN COMPARISON:  Head CT 04/02/2004. FINDINGS: CT HEAD Brain: No acute hemorrhage, mass effect or midline shift. Gray-white differentiation is preserved. No hydrocephalus. No extra-axial collection. Basilar cisterns are patent. Vascular: No hyperdense vessel or unexpected calcification. Skull: No calvarial fracture or suspicious bone lesion. Skull base is unremarkable. Sinuses/Orbits: Unremarkable. CTA HEAD Anterior circulation: Intracranial ICAs are patent without stenosis or aneurysm. The proximal ACAs and MCAs are patent without stenosis or aneurysm. Distal branches are symmetric. Posterior circulation: Normal basilar artery. The SCAs, AICAs and PICAs are patent proximally. The PCAs are patent proximally without stenosis or aneurysm. Distal branches are symmetric. Venous sinuses: Patent. Anatomic variants: Hypoplastic left A1 segment. IMPRESSION: 1. No acute intracranial abnormality. 2. No large vessel occlusion,  hemodynamically significant stenosis, or aneurysm of the head vessels. Electronically Signed   By: Emmit Alexanders M.D.   On: 07/19/2022 18:47   CT ABDOMEN PELVIS W CONTRAST  Result Date: 07/19/2022 CLINICAL DATA:  Abdominal pain. Postop hysterectomy. EXAM: CT ABDOMEN AND PELVIS WITH CONTRAST TECHNIQUE: Multidetector CT imaging of the abdomen and pelvis was performed using the standard protocol following bolus administration of intravenous contrast. RADIATION DOSE REDUCTION: This exam was performed according to the departmental dose-optimization program which includes automated exposure control, adjustment of the mA and/or kV according to patient size and/or use of iterative reconstruction technique. CONTRAST:  148mL OMNIPAQUE IOHEXOL 350 MG/ML SOLN COMPARISON:  CT 01/19/2012 FINDINGS: Lower chest: Clear lung bases. Hepatobiliary: Focal fatty infiltration adjacent to the falciform ligament. No suspicious liver lesion. Cholecystectomy. Common bile duct measures 13 mm, normal tapering to the duodenum. There is no visualized choledocholithiasis. Pancreas: No ductal dilatation or inflammation. Spleen: Normal in size without focal abnormality. Adrenals/Urinary Tract: 8 x 15 mm right adrenal nodule, Hounsfield units of 88. No left adrenal nodule. No hydronephrosis. Two nonobstructing stones in the lower pole of the left kidney, larger measuring 4 mm homogeneous renal enhancement with symmetric excretion on delayed phase imaging. Unremarkable urinary bladder. Stomach/Bowel: Gastric bypass anatomy. Small hiatal hernia. The excluded gastric remnant is decompressed. The Roux limb is tortuous, markedly dilated with fecalization of contents with transition point involving a short segment of the Roux limb proximal to the jejunal anastomosis, series 3, image 44 and series 6, image 28. The jejunal anastomosis appears patulous. There is no other small bowel dilatation. The appendix is normal. Moderate volume of colonic stool.  No definite colonic inflammation. Vascular/Lymphatic: No acute vascular findings. Normal caliber abdominal aorta. Patent portal, splenic and mesenteric veins. No bulky abdominopelvic adenopathy. Reproductive: Hysterectomy. Small to moderate volume of non organized free fluid in the dependent pelvis, no peripherally enhancing or drainable collection. The ovaries are not definitively seen, no evidence of adnexal mass. Other: Pelvic free fluid as described. No free intra-abdominal air. There is trace free fluid within the central upper abdomen. Musculoskeletal: T8 vertebral body hemangioma. Transitional lumbosacral anatomy. There are no acute or suspicious osseous abnormalities. IMPRESSION: 1. Gastric bypass anatomy. The Roux limb is tortuous, markedly dilated with fecalization of contents. Transition point involving a  short segment of the Roux limb a few cm proximal to the jejunal anastomosis, may be due to an adhesive band or stricture, but appears to be obstructed. Recommend surgical consultation. 2. Small to moderate volume of non organized free fluid in the dependent pelvis, no peripherally enhancing or drainable collection after recent hysterectomy. 3. Nonobstructing left nephrolithiasis. 4. Right adrenal nodule measuring 8 x 15 mm is indeterminate. This was described on prior outside CT from 2022, reportedly unchanged in size suggesting a benign adenoma. Electronically Signed   By: Keith Rake M.D.   On: 07/19/2022 18:11       A/P: 52yo woman with history of roux en y gastric bypass just over 3 years ago at [facility]. She has been having symptoms of bloating, nausea, diarrhea for some time now, but previous Cts have been unrevealing. She now has obstruction of her roux limb just proximal to the JJ.  Patient to be transferred to [facility].  Recommend NG placement to decompress her roux limb overnight along with fluid resuscitation.  Likely will benefit from diagnostic laparoscopy this admission.      Patient Active Problem List   Diagnosis Date Noted   AKI (acute kidney injury) ([place]) 12/29/2015   Chest pain with moderate risk for cardiac etiology 12/29/2015   Essential hypertension    Acute renal failure ([place])    Dehydration    SOB (shortness of breath)        Romana Juniper, MD [facility]  See AMION to contact appropriate on-call provider

## 2022-07-19 NOTE — ED Provider Notes (Signed)
[facility] team at bedside.  Patient fully alert and oriented normal vital signs.  Remains in some element of bigeminy but patient asymptomatic.  Nasogastric output approximately 100 mL of gastric content.  No active emesis she is fully alert and oriented.  Personally saw and evaluated her she is agreeable understanding of plan to transfer to [facility] at this time.  Patient stable for transport with [facility] at this time. (ALS level)   Delman Kitten, MD 07/19/22 2231

## 2022-07-19 NOTE — ED Provider Notes (Addendum)
[facility] Provider Note   CSN: [id number] Arrival date & time: 07/19/22  1508     History  Chief Complaint  Patient presents with   Abdominal Pain    Paula Mccormick is a 52 y.o. female.  With past medical history of chronic pain syndrome, recent hysterectomy, gastric ulcer disease, status post bariatric surgery, irritable bowel syndrome, iron deficiency anemia, hypertension, left ventricular systolic dysfunction seen by [facility] presents to the emergency department for evaluation of headache and abdominal pain with vomiting and gastric reflux.  Patient states she was moving furniture last night, felt the pain in her abdomen, she describes a pop and has been having pain since.  She also complains of a headache that has been present since lifting the couch.  She describes some photophobia.  No significant history of headaches.  She has had 1 episode of vomiting.  She denies any diarrhea, sore throat.  She also complains of some chills.  She was exposed to strep last week.  Patient status post hysterectomy 1 month ago.  PR LAP GASTRIC BYPASS/ROUX-EN-Y N/A 04/03/2019  Procedure: ROBOTIC LAPAROSCOPY, SURG, GASTRIC RESTRICT PROC; W/GASTRIC BYPASS & ROUX-EN-Y GASTROENTEROS(ROUX LIMB 150 CM/LESS); Surgeon: Alice Reichert, MD; Location: [facility]; Service: General Surgery     HPI     Home Medications Prior to Admission medications   Medication Sig Start Date End Date Taking? Authorizing Provider  metoprolol succinate (TOPROL-XL) 25 MG 24 hr tablet Take 1 tablet (25 mg total) by mouth daily. 12/30/15   Henreitta Leber, MD  omeprazole (PRILOSEC) 40 MG capsule Take 1 capsule (40 mg total) by mouth daily. 12/30/15   Henreitta Leber, MD      Allergies    Hydrochlorothiazide, Lisinopril, Morphine and related, Other, and Morphine    Review of Systems   Review of Systems  Physical Exam Updated Vital Signs BP 122/86 (BP Location: Left  Arm)   Pulse 83   Temp 97.8 F (36.6 C) (Oral)   Resp 18   LMP 01/16/2018   SpO2 95%  Physical Exam Constitutional:      Appearance: She is well-developed.  HENT:     Head: Normocephalic and atraumatic.     Right Ear: External ear normal.     Left Ear: External ear normal.     Nose: Nose normal.     Mouth/Throat:     Mouth: Mucous membranes are moist.     Pharynx: No oropharyngeal exudate or posterior oropharyngeal erythema.  Eyes:     Extraocular Movements: Extraocular movements intact.     Conjunctiva/sclera: Conjunctivae normal.     Pupils: Pupils are equal, round, and reactive to light.  Cardiovascular:     Rate and Rhythm: Normal rate.  Pulmonary:     Effort: Pulmonary effort is normal. No respiratory distress.  Abdominal:     General: Bowel sounds are normal. There is no distension.     Palpations: Abdomen is soft. There is no mass.     Tenderness: There is abdominal tenderness. There is no guarding.  Musculoskeletal:        General: Normal range of motion.     Cervical back: Normal range of motion.  Skin:    General: Skin is warm.     Capillary Refill: Capillary refill takes less than 2 seconds.     Findings: No rash.  Neurological:     General: No focal deficit present.     Mental Status: She is  alert and oriented to person, place, and time. Mental status is at baseline.     Cranial Nerves: No cranial nerve deficit.     Motor: No weakness.     Gait: Gait normal.  Psychiatric:        Mood and Affect: Mood normal.        Behavior: Behavior normal.        Thought Content: Thought content normal.     ED Results / Procedures / Treatments   Labs (all labs ordered are listed, but only abnormal results are displayed) Labs Reviewed  COMPREHENSIVE METABOLIC PANEL - Abnormal; Notable for the following components:      Result Value   Glucose, Bld 120 (*)    All other components within normal limits  GROUP A STREP BY PCR  RESP PANEL BY RT-PCR (RSV, FLU A&B,  COVID)  RVPGX2  LIPASE, BLOOD  CBC  LACTIC ACID, PLASMA  URINALYSIS, ROUTINE W REFLEX MICROSCOPIC  TROPONIN I (HIGH SENSITIVITY)  TROPONIN I (HIGH SENSITIVITY)    EKG EKG Interpretation  Date/Time:  Tuesday July 19 2022 15:26:17 EDT Ventricular Rate:  67 PR Interval:  144 QRS Duration: 82 QT Interval:  402 QTC Calculation: 424 R Axis:   76 Text Interpretation: Normal sinus rhythm Biatrial enlargement Minimal voltage criteria for LVH, may be normal variant ( Sokolow-Lyon ) Abnormal ECG When compared with ECG of 16-Jan-2018 20:23, No significant change was found Confirmed by UNCONFIRMED, DOCTOR ([id number]), editor Dwaine Deter ([id number]) on 07/19/2022 3:28:20 PM  Radiology CT Angio Head W or Wo Contrast  Result Date: 07/19/2022 CLINICAL DATA:  Headache, sudden, severe. EXAM: CT HEAD WITHOUT CONTRAST CT ANGIOGRAPHY OF THE HEAD TECHNIQUE: Contiguous axial images were obtained from the base of the skull through the vertex without intravenous contrast. Multidetector CT imaging of the head was performed using the standard protocol during bolus administration of intravenous contrast. 3D post processing, including multiplanar CT image reconstructions and MIPs were obtained to evaluate the vascular anatomy. RADIATION DOSE REDUCTION: This exam was performed according to the departmental dose-optimization program which includes automated exposure control, adjustment of the mA and/or kV according to patient size and/or use of iterative reconstruction technique. CONTRAST:  155mL OMNIPAQUE IOHEXOL 350 MG/ML SOLN COMPARISON:  Head CT 04/02/2004. FINDINGS: CT HEAD Brain: No acute hemorrhage, mass effect or midline shift. Gray-white differentiation is preserved. No hydrocephalus. No extra-axial collection. Basilar cisterns are patent. Vascular: No hyperdense vessel or unexpected calcification. Skull: No calvarial fracture or suspicious bone lesion. Skull base is unremarkable. Sinuses/Orbits: Unremarkable. CTA HEAD  Anterior circulation: Intracranial ICAs are patent without stenosis or aneurysm. The proximal ACAs and MCAs are patent without stenosis or aneurysm. Distal branches are symmetric. Posterior circulation: Normal basilar artery. The SCAs, AICAs and PICAs are patent proximally. The PCAs are patent proximally without stenosis or aneurysm. Distal branches are symmetric. Venous sinuses: Patent. Anatomic variants: Hypoplastic left A1 segment. IMPRESSION: 1. No acute intracranial abnormality. 2. No large vessel occlusion, hemodynamically significant stenosis, or aneurysm of the head vessels. Electronically Signed   By: Emmit Alexanders M.D.   On: 07/19/2022 18:47   CT ABDOMEN PELVIS W CONTRAST  Result Date: 07/19/2022 CLINICAL DATA:  Abdominal pain. Postop hysterectomy. EXAM: CT ABDOMEN AND PELVIS WITH CONTRAST TECHNIQUE: Multidetector CT imaging of the abdomen and pelvis was performed using the standard protocol following bolus administration of intravenous contrast. RADIATION DOSE REDUCTION: This exam was performed according to the departmental dose-optimization program which includes automated exposure control, adjustment of the mA and/or  kV according to patient size and/or use of iterative reconstruction technique. CONTRAST:  154mL OMNIPAQUE IOHEXOL 350 MG/ML SOLN COMPARISON:  CT 01/19/2012 FINDINGS: Lower chest: Clear lung bases. Hepatobiliary: Focal fatty infiltration adjacent to the falciform ligament. No suspicious liver lesion. Cholecystectomy. Common bile duct measures 13 mm, normal tapering to the duodenum. There is no visualized choledocholithiasis. Pancreas: No ductal dilatation or inflammation. Spleen: Normal in size without focal abnormality. Adrenals/Urinary Tract: 8 x 15 mm right adrenal nodule, Hounsfield units of 88. No left adrenal nodule. No hydronephrosis. Two nonobstructing stones in the lower pole of the left kidney, larger measuring 4 mm homogeneous renal enhancement with symmetric excretion on  delayed phase imaging. Unremarkable urinary bladder. Stomach/Bowel: Gastric bypass anatomy. Small hiatal hernia. The excluded gastric remnant is decompressed. The Roux limb is tortuous, markedly dilated with fecalization of contents with transition point involving a short segment of the Roux limb proximal to the jejunal anastomosis, series 3, image 44 and series 6, image 28. The jejunal anastomosis appears patulous. There is no other small bowel dilatation. The appendix is normal. Moderate volume of colonic stool. No definite colonic inflammation. Vascular/Lymphatic: No acute vascular findings. Normal caliber abdominal aorta. Patent portal, splenic and mesenteric veins. No bulky abdominopelvic adenopathy. Reproductive: Hysterectomy. Small to moderate volume of non organized free fluid in the dependent pelvis, no peripherally enhancing or drainable collection. The ovaries are not definitively seen, no evidence of adnexal mass. Other: Pelvic free fluid as described. No free intra-abdominal air. There is trace free fluid within the central upper abdomen. Musculoskeletal: T8 vertebral body hemangioma. Transitional lumbosacral anatomy. There are no acute or suspicious osseous abnormalities. IMPRESSION: 1. Gastric bypass anatomy. The Roux limb is tortuous, markedly dilated with fecalization of contents. Transition point involving a short segment of the Roux limb a few cm proximal to the jejunal anastomosis, may be due to an adhesive band or stricture, but appears to be obstructed. Recommend surgical consultation. 2. Small to moderate volume of non organized free fluid in the dependent pelvis, no peripherally enhancing or drainable collection after recent hysterectomy. 3. Nonobstructing left nephrolithiasis. 4. Right adrenal nodule measuring 8 x 15 mm is indeterminate. This was described on prior outside CT from 2022, reportedly unchanged in size suggesting a benign adenoma. Electronically Signed   By: Keith Rake  M.D.   On: 07/19/2022 18:11    Procedures Procedures    Medications Ordered in ED Medications  ondansetron (ZOFRAN) injection 4 mg (4 mg Intravenous Not Given 07/19/22 1727)  HYDROmorphone (DILAUDID) injection 0.5 mg (0.5 mg Intravenous Given 07/19/22 1725)  sodium chloride 0.9 % bolus 1,000 mL (0 mLs Intravenous Stopped 07/19/22 2034)  iohexol (OMNIPAQUE) 350 MG/ML injection 100 mL (100 mLs Intravenous Contrast Given 07/19/22 1730)  HYDROmorphone (DILAUDID) injection 0.5 mg (0.5 mg Intravenous Given 07/19/22 1923)  diphenhydrAMINE (BENADRYL) injection 25 mg (25 mg Intravenous Given 07/19/22 1923)  prochlorperazine (COMPAZINE) injection 10 mg (10 mg Intravenous Given 07/19/22 1923)    ED Course/ Medical Decision Making/ A&P                             Medical Decision Making Amount and/or Complexity of Data Reviewed Labs: ordered. Radiology: ordered.  Risk Prescription drug management.   52 year old female with history of gastric bypass surgery in 2020 and hysterectomy 1 month ago presents for acute abdominal pain along with headache nausea vomiting and reflux.  Patient states yesterday she felt some abdominal pain after lifting  a couch, pain was sharp and sudden.  Since last night and throughout the day today she has had nausea, 1 episode of vomiting and a lot of reflux.  She has had no appetite and has not had much to eat or drink.  She denies any fevers but has had some slight chills.  She also complains of moderate to severe headache with no known trauma or injury.  No vision changes or neurological deficits.  CT angio of the head did not show any intracranial hemorrhaging, stenosis or aneurysms.  Patient had a CT of the abdomen and pelvis that did show some concern for obstruction of the Roux limb from her previous gastric bypass surgery.  Discussed case with our general surgeon on-call who recommended consultation with bariatric.  [facility] called [facility] general surgery who was on diversion.   We then called [facility] who agreed to see patient, bariatric surgeons are present at [facility].  [facility] had no beds available but they were agreeable to ED to ED transfer, [facility]ariat[facility] surgeons would see patient within the ED. [facility] general surgery on call reccommended placement of NG tube which was successfully placed in ED. Patient's labs and vital signs are stable.  She is afebrile with normal blood work.  Headache pain resolved.  She has minimal nausea but continues to have some reflux.  Abdominal pain much improved.  Patient stable and ready for transfer to [facility]. Final Clinical Impression(s) / ED Diagnoses Final diagnoses:  History of Roux-en-Y gastric bypass  Intestinal obstruction, unspecified cause, unspecified whether partial or complete ([place])  Generalized abdominal pain  Acute nonintractable headache, unspecified headache type    Rx / DC Orders ED Discharge Orders     None         Duanne Guess, PA-C 07/19/22 2042    Duanne Guess, PA-C 07/19/22 2100    Delman Kitten, MD 07/19/22 2313

## 2022-07-20 ENCOUNTER — Other Ambulatory Visit: Payer: Self-pay

## 2022-07-20 ENCOUNTER — Encounter (HOSPITAL_COMMUNITY): Payer: Self-pay

## 2022-07-20 ENCOUNTER — Observation Stay (HOSPITAL_COMMUNITY): Payer: Medicaid Other | Admitting: Anesthesiology

## 2022-07-20 ENCOUNTER — Encounter (HOSPITAL_COMMUNITY)
Admission: EM | Disposition: A | Discharge status: Home / Self Care | Payer: Self-pay | Source: Other Acute Inpatient Hospital

## 2022-07-20 DIAGNOSIS — R1084 Generalized abdominal pain: Secondary | ICD-10-CM | POA: Diagnosis not present

## 2022-07-20 DIAGNOSIS — K5669 Other partial intestinal obstruction: Secondary | ICD-10-CM | POA: Diagnosis not present

## 2022-07-20 DIAGNOSIS — K565 Intestinal adhesions [bands], unspecified as to partial versus complete obstruction: Secondary | ICD-10-CM | POA: Diagnosis not present

## 2022-07-20 DIAGNOSIS — K5651 Intestinal adhesions [bands], with partial obstruction: Secondary | ICD-10-CM | POA: Diagnosis not present

## 2022-07-20 DIAGNOSIS — K460 Unspecified abdominal hernia with obstruction, without gangrene: Secondary | ICD-10-CM | POA: Diagnosis not present

## 2022-07-20 DIAGNOSIS — Z9884 Bariatric surgery status: Secondary | ICD-10-CM | POA: Diagnosis not present

## 2022-07-20 HISTORY — PX: LAPAROTOMY: SHX154

## 2022-07-20 LAB — CBC
HCT: 38 % (ref 36.0–46.0)
Hemoglobin: 12.1 g/dL (ref 12.0–15.0)
MCH: 28.5 pg (ref 26.0–34.0)
MCHC: 31.8 g/dL (ref 30.0–36.0)
MCV: 89.6 fL (ref 80.0–100.0)
Platelets: 210 10*3/uL (ref 150–400)
RBC: 4.24 MIL/uL (ref 3.87–5.11)
RDW: 14.5 % (ref 11.5–15.5)
WBC: 11.8 10*3/uL — ABNORMAL HIGH (ref 4.0–10.5)
nRBC: 0 % (ref 0.0–0.2)

## 2022-07-20 LAB — BASIC METABOLIC PANEL
Anion gap: 7 (ref 5–15)
BUN: 22 mg/dL — ABNORMAL HIGH (ref 6–20)
CO2: 23 mmol/L (ref 22–32)
Calcium: 8.7 mg/dL — ABNORMAL LOW (ref 8.9–10.3)
Chloride: 107 mmol/L (ref 98–111)
Creatinine, Ser: 0.86 mg/dL (ref 0.44–1.00)
GFR, Estimated: >60 mL/min (ref >60)
Glucose, Bld: 126 mg/dL — ABNORMAL HIGH (ref 70–99)
Potassium: 3.8 mmol/L (ref 3.5–5.1)
Sodium: 137 mmol/L (ref 135–145)

## 2022-07-20 LAB — HIV ANTIBODY (ROUTINE TESTING W REFLEX): HIV Screen 4th Generation wRfx: NONREACTIVE

## 2022-07-20 LAB — TYPE AND SCREEN
ABO/RH(D): AB POS
Antibody Screen: NEGATIVE

## 2022-07-20 LAB — ABO/RH: ABO/RH(D): AB POS

## 2022-07-20 SURGERY — LAPAROTOMY, EXPLORATORY
Anesthesia: General

## 2022-07-20 MED ORDER — LIDOCAINE 2% (20 MG/ML) 5 ML SYRINGE
INTRAMUSCULAR | Status: DC | PRN
  Administered 2022-07-20: 100 mg via INTRAVENOUS

## 2022-07-20 MED ORDER — LACTATED RINGERS IR SOLN
Status: DC | PRN
  Administered 2022-07-20: 1000 mL

## 2022-07-20 MED ORDER — DIPHENHYDRAMINE HCL 12.5 MG/5ML PO ELIX
12.5000 mg | ORAL_SOLUTION | Freq: Four times a day (QID) | ORAL | Status: DC | PRN
  Administered 2022-07-22 – 2022-07-25 (×3): 12.5 mg via ORAL
  Filled 2022-07-20 (×3): qty 5

## 2022-07-20 MED ORDER — HYDROMORPHONE HCL 1 MG/ML IJ SOLN
1.0000 mg | INTRAMUSCULAR | Status: DC | PRN
  Administered 2022-07-20 – 2022-07-26 (×32): 1 mg via INTRAVENOUS
  Filled 2022-07-20 (×32): qty 1

## 2022-07-20 MED ORDER — KETOROLAC TROMETHAMINE 30 MG/ML IJ SOLN
INTRAMUSCULAR | Status: AC
  Filled 2022-07-20: qty 1

## 2022-07-20 MED ORDER — ACETAMINOPHEN 10 MG/ML IV SOLN
1000.0000 mg | Freq: Four times a day (QID) | INTRAVENOUS | Status: AC
  Administered 2022-07-20 (×4): 1000 mg via INTRAVENOUS
  Filled 2022-07-20 (×4): qty 100

## 2022-07-20 MED ORDER — ROCURONIUM BROMIDE 10 MG/ML (PF) SYRINGE
PREFILLED_SYRINGE | INTRAVENOUS | Status: DC | PRN
  Administered 2022-07-20: 50 mg via INTRAVENOUS

## 2022-07-20 MED ORDER — 0.9 % SODIUM CHLORIDE (POUR BTL) OPTIME
TOPICAL | Status: DC | PRN
  Administered 2022-07-20: 1000 mL

## 2022-07-20 MED ORDER — LACTATED RINGERS IV SOLN: INTRAVENOUS | Status: DC | PRN

## 2022-07-20 MED ORDER — LACTATED RINGERS IV SOLN: INTRAVENOUS | Status: DC

## 2022-07-20 MED ORDER — PHENYLEPHRINE HCL-NACL 20-0.9 MG/250ML-% IV SOLN
INTRAVENOUS | Status: DC | PRN
  Administered 2022-07-20: 45 ug/min via INTRAVENOUS

## 2022-07-20 MED ORDER — KETOROLAC TROMETHAMINE 30 MG/ML IJ SOLN
INTRAMUSCULAR | Status: DC | PRN
  Administered 2022-07-20: 30 mg via INTRAVENOUS

## 2022-07-20 MED ORDER — MIDAZOLAM HCL 2 MG/2ML IJ SOLN
INTRAMUSCULAR | Status: AC
  Filled 2022-07-20: qty 2

## 2022-07-20 MED ORDER — SUCCINYLCHOLINE CHLORIDE 200 MG/10ML IV SOSY
PREFILLED_SYRINGE | INTRAVENOUS | Status: DC | PRN
  Administered 2022-07-20: 100 mg via INTRAVENOUS

## 2022-07-20 MED ORDER — THIAMINE HCL 100 MG/ML IJ SOLN
100.0000 mg | Freq: Once | INTRAMUSCULAR | Status: AC
  Administered 2022-07-20: 100 mg via INTRAVENOUS
  Filled 2022-07-20: qty 2

## 2022-07-20 MED ORDER — MIDAZOLAM HCL 5 MG/5ML IJ SOLN
INTRAMUSCULAR | Status: DC | PRN
  Administered 2022-07-20: 1 mg via INTRAVENOUS

## 2022-07-20 MED ORDER — OXYCODONE HCL 5 MG PO TABS: 5.0000 mg | ORAL_TABLET | Freq: Once | ORAL | Status: DC | PRN

## 2022-07-20 MED ORDER — ONDANSETRON HCL 4 MG/2ML IJ SOLN
4.0000 mg | Freq: Four times a day (QID) | INTRAMUSCULAR | Status: DC | PRN
  Administered 2022-07-21: 4 mg via INTRAVENOUS
  Filled 2022-07-20: qty 2

## 2022-07-20 MED ORDER — LIDOCAINE HCL 2 % IJ SOLN
INTRAMUSCULAR | Status: AC
  Filled 2022-07-20: qty 20

## 2022-07-20 MED ORDER — HYDROMORPHONE HCL 1 MG/ML IJ SOLN
INTRAMUSCULAR | Status: AC
  Filled 2022-07-20: qty 1

## 2022-07-20 MED ORDER — KETAMINE HCL 50 MG/5ML IJ SOSY
PREFILLED_SYRINGE | INTRAMUSCULAR | Status: AC
  Filled 2022-07-20: qty 5

## 2022-07-20 MED ORDER — BUPIVACAINE-EPINEPHRINE (PF) 0.25% -1:200000 IJ SOLN
INTRAMUSCULAR | Status: DC | PRN
  Administered 2022-07-20: 50 mL

## 2022-07-20 MED ORDER — BUPIVACAINE LIPOSOME 1.3 % IJ SUSP
INTRAMUSCULAR | Status: AC
  Filled 2022-07-20: qty 20

## 2022-07-20 MED ORDER — METHOCARBAMOL 1000 MG/10ML IJ SOLN: 500.0000 mg | Freq: Three times a day (TID) | INTRAVENOUS | Status: DC | PRN

## 2022-07-20 MED ORDER — DIPHENHYDRAMINE HCL 50 MG/ML IJ SOLN
12.5000 mg | Freq: Four times a day (QID) | INTRAMUSCULAR | Status: DC | PRN
  Administered 2022-07-21 – 2022-07-22 (×4): 12.5 mg via INTRAVENOUS
  Filled 2022-07-20 (×4): qty 1

## 2022-07-20 MED ORDER — ONDANSETRON 4 MG PO TBDP: 4.0000 mg | ORAL_TABLET | Freq: Four times a day (QID) | ORAL | Status: DC | PRN

## 2022-07-20 MED ORDER — METHOCARBAMOL 500 MG PO TABS
500.0000 mg | ORAL_TABLET | Freq: Three times a day (TID) | ORAL | Status: DC | PRN
  Administered 2022-07-21 (×2): 500 mg via ORAL
  Filled 2022-07-20 (×3): qty 1

## 2022-07-20 MED ORDER — ORAL CARE MOUTH RINSE: 15.0000 mL | Freq: Once | OROMUCOSAL | Status: AC

## 2022-07-20 MED ORDER — PHENYLEPHRINE 80 MCG/ML (10ML) SYRINGE FOR IV PUSH (FOR BLOOD PRESSURE SUPPORT)
PREFILLED_SYRINGE | INTRAVENOUS | Status: DC | PRN
  Administered 2022-07-20: 80 ug via INTRAVENOUS

## 2022-07-20 MED ORDER — PROPOFOL 10 MG/ML IV BOLUS
INTRAVENOUS | Status: DC | PRN
  Administered 2022-07-20: 150 mg via INTRAVENOUS

## 2022-07-20 MED ORDER — DROPERIDOL 2.5 MG/ML IJ SOLN
INTRAMUSCULAR | Status: DC | PRN
  Administered 2022-07-20: .625 mg via INTRAVENOUS

## 2022-07-20 MED ORDER — ACETAMINOPHEN 10 MG/ML IV SOLN: 1000.0000 mg | Freq: Once | INTRAVENOUS | Status: DC | PRN

## 2022-07-20 MED ORDER — FENTANYL CITRATE (PF) 100 MCG/2ML IJ SOLN
INTRAMUSCULAR | Status: AC
  Filled 2022-07-20: qty 2

## 2022-07-20 MED ORDER — SUCCINYLCHOLINE CHLORIDE 200 MG/10ML IV SOSY
PREFILLED_SYRINGE | INTRAVENOUS | Status: AC
  Filled 2022-07-20: qty 10

## 2022-07-20 MED ORDER — BUPIVACAINE-EPINEPHRINE (PF) 0.25% -1:200000 IJ SOLN
INTRAMUSCULAR | Status: AC
  Filled 2022-07-20: qty 30

## 2022-07-20 MED ORDER — HYDROMORPHONE HCL 1 MG/ML IJ SOLN: 0.2500 mg | INTRAMUSCULAR | Status: DC | PRN

## 2022-07-20 MED ORDER — DEXAMETHASONE SODIUM PHOSPHATE 10 MG/ML IJ SOLN
INTRAMUSCULAR | Status: DC | PRN
  Administered 2022-07-20: 6 mg via INTRAVENOUS

## 2022-07-20 MED ORDER — ONDANSETRON HCL 4 MG/2ML IJ SOLN
INTRAMUSCULAR | Status: DC | PRN
  Administered 2022-07-20: 4 mg via INTRAVENOUS

## 2022-07-20 MED ORDER — EPHEDRINE SULFATE-NACL 50-0.9 MG/10ML-% IV SOSY
PREFILLED_SYRINGE | INTRAVENOUS | Status: DC | PRN
  Administered 2022-07-20: 10 mg via INTRAVENOUS

## 2022-07-20 MED ORDER — LIP MEDEX EX OINT
TOPICAL_OINTMENT | CUTANEOUS | Status: AC
  Filled 2022-07-20: qty 7

## 2022-07-20 MED ORDER — POTASSIUM CHLORIDE IN NACL 20-0.9 MEQ/L-% IV SOLN
INTRAVENOUS | Status: DC
  Filled 2022-07-20 (×8): qty 1000

## 2022-07-20 MED ORDER — OXYCODONE HCL 5 MG/5ML PO SOLN: 5.0000 mg | Freq: Once | ORAL | Status: DC | PRN

## 2022-07-20 MED ORDER — PHENYLEPHRINE 80 MCG/ML (10ML) SYRINGE FOR IV PUSH (FOR BLOOD PRESSURE SUPPORT)
PREFILLED_SYRINGE | INTRAVENOUS | Status: AC
  Filled 2022-07-20: qty 10

## 2022-07-20 MED ORDER — LIP MEDEX EX OINT: TOPICAL_OINTMENT | CUTANEOUS | Status: DC | PRN

## 2022-07-20 MED ORDER — SUGAMMADEX SODIUM 200 MG/2ML IV SOLN
INTRAVENOUS | Status: DC | PRN
  Administered 2022-07-20: 200 mg via INTRAVENOUS

## 2022-07-20 MED ORDER — KETAMINE HCL 10 MG/ML IJ SOLN
INTRAMUSCULAR | Status: DC | PRN
  Administered 2022-07-20: 20 mg via INTRAVENOUS

## 2022-07-20 MED ORDER — ONDANSETRON HCL 4 MG/2ML IJ SOLN: 4.0000 mg | Freq: Once | INTRAMUSCULAR | Status: DC | PRN

## 2022-07-20 MED ORDER — FENTANYL CITRATE (PF) 100 MCG/2ML IJ SOLN
INTRAMUSCULAR | Status: DC | PRN
  Administered 2022-07-20: 100 ug via INTRAVENOUS

## 2022-07-20 MED ORDER — LIDOCAINE HCL (PF) 2 % IJ SOLN
INTRAMUSCULAR | Status: DC | PRN
  Administered 2022-07-20: 1.5 mg/kg/h via INTRADERMAL

## 2022-07-20 MED ORDER — ONDANSETRON HCL 4 MG/2ML IJ SOLN
INTRAMUSCULAR | Status: AC
  Filled 2022-07-20: qty 2

## 2022-07-20 MED ORDER — ENOXAPARIN SODIUM 40 MG/0.4ML IJ SOSY
40.0000 mg | PREFILLED_SYRINGE | INTRAMUSCULAR | Status: DC
  Administered 2022-07-20 – 2022-07-25 (×6): 40 mg via SUBCUTANEOUS
  Filled 2022-07-20 (×6): qty 0.4

## 2022-07-20 MED ORDER — CEFAZOLIN SODIUM-DEXTROSE 2-3 GM-%(50ML) IV SOLR
INTRAVENOUS | Status: DC | PRN
  Administered 2022-07-20: 2 g via INTRAVENOUS

## 2022-07-20 MED ORDER — CEFAZOLIN SODIUM 1 G IJ SOLR
INTRAMUSCULAR | Status: AC
  Filled 2022-07-20: qty 10

## 2022-07-20 MED ORDER — PANTOPRAZOLE SODIUM 40 MG IV SOLR
40.0000 mg | Freq: Every day | INTRAVENOUS | Status: DC
  Administered 2022-07-20 – 2022-07-22 (×3): 40 mg via INTRAVENOUS
  Filled 2022-07-20 (×3): qty 10

## 2022-07-20 MED ORDER — TRAMADOL HCL 50 MG PO TABS
50.0000 mg | ORAL_TABLET | Freq: Four times a day (QID) | ORAL | Status: DC | PRN
  Administered 2022-07-21: 100 mg via ORAL
  Filled 2022-07-20 (×2): qty 2

## 2022-07-20 MED ORDER — CHLORHEXIDINE GLUCONATE 0.12 % MT SOLN
15.0000 mL | Freq: Once | OROMUCOSAL | Status: AC
  Administered 2022-07-20: 15 mL via OROMUCOSAL

## 2022-07-20 SURGICAL SUPPLY — 52 items
ADH SKN CLS APL DERMABOND .7 (GAUZE/BANDAGES/DRESSINGS) ×1
APL PRP STRL LF DISP 70% ISPRP (MISCELLANEOUS) ×1
BAG COUNTER SPONGE SURGICOUNT (BAG) IMPLANT
BAG SPNG CNTER NS LX DISP (BAG)
BLADE EXTENDED COATED 6.5IN (ELECTRODE) IMPLANT
CELLS DAT CNTRL 66122 CELL SVR (MISCELLANEOUS) IMPLANT
CHLORAPREP W/TINT 26 (MISCELLANEOUS) ×1 IMPLANT
DERMABOND ADVANCED .7 DNX12 (GAUZE/BANDAGES/DRESSINGS) IMPLANT
DRAIN CHANNEL 19F RND (DRAIN) IMPLANT
DRAPE LAPAROSCOPIC ABDOMINAL (DRAPES) ×1 IMPLANT
DRAPE SHEET LG 3/4 BI-LAMINATE (DRAPES) IMPLANT
DRSG OPSITE POSTOP 4X10 (GAUZE/BANDAGES/DRESSINGS) IMPLANT
DRSG OPSITE POSTOP 4X6 (GAUZE/BANDAGES/DRESSINGS) IMPLANT
DRSG OPSITE POSTOP 4X8 (GAUZE/BANDAGES/DRESSINGS) IMPLANT
ELECT REM PT RETURN 15FT ADLT (MISCELLANEOUS) ×1 IMPLANT
EVACUATOR SILICONE 100CC (DRAIN) IMPLANT
GAUZE SPONGE 4X4 12PLY STRL (GAUZE/BANDAGES/DRESSINGS) IMPLANT
GLOVE BIO SURGEON STRL SZ 6.5 (GLOVE) ×2 IMPLANT
GLOVE BIOGEL PI IND STRL 7.0 (GLOVE) ×2 IMPLANT
GLOVE INDICATOR 6.5 STRL GRN (GLOVE) ×1 IMPLANT
GOWN STRL REUS W/ TWL XL LVL3 (GOWN DISPOSABLE) ×3 IMPLANT
GOWN STRL REUS W/TWL XL LVL3 (GOWN DISPOSABLE) ×3
HANDLE SUCTION POOLE (INSTRUMENTS) IMPLANT
KIT TURNOVER KIT A (KITS) IMPLANT
LEGGING LITHOTOMY PAIR STRL (DRAPES) IMPLANT
PACK COLON (CUSTOM PROCEDURE TRAY) ×1 IMPLANT
PENCIL SMOKE EVACUATOR (MISCELLANEOUS) IMPLANT
RETRACTOR WND ALEXIS 18 MED (MISCELLANEOUS) IMPLANT
RETRACTOR WND ALEXIS 25 LRG (MISCELLANEOUS) IMPLANT
RTRCTR WOUND ALEXIS 18CM MED (MISCELLANEOUS)
RTRCTR WOUND ALEXIS 25CM LRG (MISCELLANEOUS)
SHEARS HARMONIC ACE PLUS 36CM (ENDOMECHANICALS) IMPLANT
STAPLER VISISTAT 35W (STAPLE) ×1 IMPLANT
SUCTION POOLE HANDLE (INSTRUMENTS)
SUT ETHILON 3 0 PS 1 (SUTURE) IMPLANT
SUT NOVA 1 T20/GS 25DT (SUTURE) ×2 IMPLANT
SUT SILK 2 0 (SUTURE) ×1
SUT SILK 2 0 SH (SUTURE) IMPLANT
SUT SILK 2 0 SH CR/8 (SUTURE) ×1 IMPLANT
SUT SILK 2-0 18XBRD TIE 12 (SUTURE) ×1 IMPLANT
SUT SILK 3 0 (SUTURE) ×1
SUT SILK 3 0 SH CR/8 (SUTURE) ×1 IMPLANT
SUT SILK 3-0 18XBRD TIE 12 (SUTURE) ×1 IMPLANT
SUT VIC AB 2-0 SH 18 (SUTURE) ×1 IMPLANT
SUT VIC AB 2-0 SH 27 (SUTURE) ×1
SUT VIC AB 2-0 SH 27X BRD (SUTURE) IMPLANT
SUT VIC AB 4-0 PS2 18 (SUTURE) IMPLANT
TOWEL OR 17X26 10 PK STRL BLUE (TOWEL DISPOSABLE) IMPLANT
TOWEL OR NON WOVEN STRL DISP B (DISPOSABLE) ×1 IMPLANT
TRAY FOLEY MTR SLVR 16FR STAT (SET/KITS/TRAYS/PACK) ×1 IMPLANT
TROCAR BALLN 12MMX100 BLUNT (TROCAR) IMPLANT
TUBING CONNECTING 10 (TUBING) ×2 IMPLANT

## 2022-07-20 NOTE — Transfer of Care (Signed)
Immediate Anesthesia Transfer of Care Note  Patient: Paula Mccormick  Procedure(s) Performed: DIAGNOSTIC LAPAROSCOPY, LYSIS OF ADHESIONS, CLOSURE OF MESENTERIC DEFECT  Patient Location: PACU  Anesthesia Type:General  Level of Consciousness: sedated  Airway & Oxygen Therapy: Patient Spontanous Breathing and Patient connected to face mask oxygen  Post-op Assessment: Report given to RN and Post -op Vital signs reviewed and stable  Post vital signs: Reviewed and stable  Last Vitals:  Vitals Value Taken Time  BP 136/78 07/20/22 1150  Temp    Pulse 72 07/20/22 1151  Resp    SpO2 100 % 07/20/22 1151  Vitals shown include unvalidated device data.  Last Pain:  Vitals:   07/20/22 0902  TempSrc: Oral  PainSc:       Patients Stated Pain Goal: 2 ([id number])  Complications: No notable events documented.

## 2022-07-20 NOTE — TOC CM/SW Note (Signed)
Transition of Care [facility]) Screening Note  Patient Details  Name: Paula Mccormick Date of Birth: 1970/06/20  Transition of Care [facility]) CM/SW Contact:    Sherie Don, LCSW Phone Number: 07/20/2022, 9:50 AM  Transition of Care Department [facility]) has reviewed patient and no TOC needs have been identified at this time. We will continue to monitor patient advancement through interdisciplinary progression rounds. If new patient transition needs arise, please place a TOC consult.

## 2022-07-20 NOTE — Anesthesia Postprocedure Evaluation (Signed)
Anesthesia Post Note  Patient: Estee Soifer  Procedure(s) Performed: DIAGNOSTIC LAPAROSCOPY, LYSIS OF ADHESIONS, CLOSURE OF MESENTERIC DEFECT     Patient location during evaluation: PACU Anesthesia Type: General Level of consciousness: awake and alert Pain management: pain level controlled Vital Signs Assessment: post-procedure vital signs reviewed and stable Respiratory status: spontaneous breathing, nonlabored ventilation, respiratory function stable and patient connected to nasal cannula oxygen Cardiovascular status: blood pressure returned to baseline and stable Postop Assessment: no apparent nausea or vomiting Anesthetic complications: no  No notable events documented.  Last Vitals:  Vitals:   07/20/22 1200 07/20/22 1215  BP: 131/72 119/67  Pulse: 69 63  Resp: 15 20  Temp:    SpO2: 100% 100%    Last Pain:  Vitals:   07/20/22 1215  TempSrc:   PainSc: 0-No pain                 Camilla Skeen S

## 2022-07-20 NOTE — ED Notes (Signed)
ED TO INPATIENT HANDOFF REPORT  ED Nurse Name and Phone #: Alroy Bailiff Name/Age/Gender Geanie Berlin 52 y.o. female Room/Bed: WA01/WA01  Code Status   Code Status: Full Code  Home/SNF/Other Home Patient oriented to: self, place, time, and situation Is this baseline? Yes   Triage Complete: Triage complete  Chief Complaint SBO (small bowel obstruction) ([place]) [K56.609]  Triage Note Patient is a transfer from [place], is being seen by bariatric surgery for possible complications    Allergies Allergies  Allergen Reactions   Hydrochlorothiazide Other (See Comments)    Severe dehydration Severe dehydration    Lisinopril Other (See Comments)    Other reaction(s): Other (See Comments)   Morphine And Related Hives   Other Hives   Morphine Itching and Rash    Level of Care/Admitting Diagnosis ED Disposition     ED Disposition  Admit   Condition  --   Comment  Hospital Area: [place] [[id number]]  Level of Care: Med-Surg [16]  May place patient in observation at [facility] or [place] if equivalent level of care is available:: No  Covid Evaluation: Asymptomatic - no recent exposure (last 10 days) testing not required  Diagnosis: S[facility] (small bowel obstruction) [id number]  Admitting Physician: Minkler, Barlow  Attending Physician: CCS, MD [[id number]]          B Medical/Surgery History Past Medical History:  Diagnosis Date   Essential hypertension    Kidney stones    Past Surgical History:  Procedure Laterality Date   ABDOMINAL HYSTERECTOMY     GALLBLADDER SURGERY     KNEE ARTHROSCOPY       A IV Location/Drains/Wounds Patient Lines/Drains/Airways Status     Active Line/Drains/Airways     Name Placement date Placement time Site Days   Peripheral IV 07/19/22 20 G Right Antecubital 07/19/22  1522  Antecubital  1   NG/OG Vented/Dual Lumen 16 Fr. Right nare Marking at nare/corner of mouth 55 cm 07/19/22  2130  Right nare  1             Intake/Output Last 24 hours No intake or output data in the 24 hours ending 07/20/22 0046  Labs/Imaging Results for orders placed or performed during the hospital encounter of 07/19/22 (from the past 48 hour(s))  Lipase, blood     Status: None   Collection Time: 07/19/22  3:24 PM  Result Value Ref Range   Lipase 33 11 - 51 U/L    Comment: Performed at [facility], [address] [place], [place] [address]  Comprehensive metabolic panel     Status: Abnormal   Collection Time: 07/19/22  3:24 PM  Result Value Ref Range   Sodium 137 135 - 145 mmol/L   Potassium 4.2 3.5 - 5.1 mmol/L   Chloride 106 98 - 111 mmol/L   CO2 25 22 - 32 mmol/L   Glucose, Bld 120 (H) 70 - 99 mg/dL    Comment: Glucose reference range applies only to samples taken after fasting for at least 8 hours.   BUN 19 6 - 20 mg/dL   Creatinine, Ser 0.82 0.44 - 1.00 mg/dL   Calcium 8.9 8.9 - 10.3 mg/dL   Total Protein 7.2 6.5 - 8.1 g/dL   Albumin 3.7 3.5 - 5.0 g/dL   AST 24 15 - 41 U/L   ALT 20 0 - 44 U/L   Alkaline Phosphatase 109 38 - 126 U/L   Total Bilirubin 0.7 0.3 - 1.2  mg/dL   GFR, Estimated >60 >60 mL/min    Comment: (NOTE) Calculated using the CKD-EPI Creatinine Equation (2021)    Anion gap 6 5 - 15    Comment: Performed at [facility], [address] [place], [place] [address]  CBC     Status: None   Collection Time: 07/19/22  3:24 PM  Result Value Ref Range   WBC 5.4 4.0 - 10.5 K/uL   RBC 4.73 3.87 - 5.11 MIL/uL   Hemoglobin 13.4 12.0 - 15.0 g/dL   HCT 41.6 36.0 - 46.0 %   MCV 87.9 80.0 - 100.0 fL   MCH 28.3 26.0 - 34.0 pg   MCHC 32.2 30.0 - 36.0 g/dL   RDW 14.3 11.5 - 15.5 %   Platelets 278 150 - 400 K/uL   nRBC 0.0 0.0 - 0.2 %    Comment: Performed at [facility], [place], [place] [address]  Troponin I (High Sensitivity)     Status: None   Collection Time: 07/19/22  3:25 PM  Result Value Ref Range   Troponin I (High Sensitivity) 3 <18  ng/L    Comment: (NOTE) Elevated high sensitivity troponin I (hsTnI) values and significant  changes across serial measurements may suggest ACS but many other  chronic and acute conditions are known to elevate hsTnI results.  Refer to the "Links" section for chest pain algorithms and additional  guidance. Performed at [facility], [place], [place] [address]   Group A Strep by PCR [facility] Only)     Status: None   Collection Time: 07/19/22  6:40 PM   Specimen: Throat; Sterile Swab  Result Value Ref Range   Group A Strep by PCR NOT DETECTED NOT DETECTED    Comment: Performed at [facility], [address] [place],  [address]  Resp panel by RT-PCR (RSV, Flu A&B, Covid) Anterior Nasal Swab     Status: None   Collection Time: 07/19/22  6:40 PM   Specimen: Anterior Nasal Swab  Result Value Ref Range   SARS Coronavirus 2 by RT PCR NEGATIVE NEGATIVE    Comment: (NOTE) SARS-CoV-2 target nucleic acids are NOT DETECTED.  The SARS-CoV-2 RNA is generally detectable in upper respiratory specimens during the acute phase of infection. The lowest concentration of SARS-CoV-2 viral copies this assay can detect is 138 copies/mL. A negative result does not preclude SARS-Cov-2 infection and should not be used as the sole basis for treatment or other patient management decisions. A negative result may occur with  improper specimen collection/handling, submission of specimen other than nasopharyngeal swab, presence of viral mutation(s) within the areas targeted by this assay, and inadequate number of viral copies(<138 copies/mL). A negative result must be combined with clinical observations, patient history, and epidemiological information. The expected result is Negative.  Fact Sheet for Patients:  EntrepreneurPulse.com.au  Fact Sheet for Healthcare Providers:  IncredibleEmployment.be  This test is no t yet approved or  cleared by the [place] FDA and  has been authorized for detection and/or diagnosis of SARS-CoV-2 by FDA under an Emergency Use Authorization (EUA). This EUA will remain  in effect (meaning this test can be used) for the duration of the COVID-19 declaration under Section 564(b)(1) of the Act, 21 U.S.C.section 360bbb-3(b)(1), unless the authorization is terminated  or revoked sooner.       Influenza A by PCR NEGATIVE NEGATIVE   Influenza B by PCR NEGATIVE NEGATIVE    Comment: (NOTE)  The Xpert Xpress SARS-CoV-2/FLU/RSV plus assay is intended as an aid in the diagnosis of influenza from Nasopharyngeal swab specimens and should not be used as a sole basis for treatment. Nasal washings and aspirates are unacceptable for Xpert Xpress SARS-CoV-2/FLU/RSV testing.  Fact Sheet for Patients: EntrepreneurPulse.com.au  Fact Sheet for Healthcare Providers: IncredibleEmployment.be  This test is not yet approved or cleared by the [place] FDA and has been authorized for detection and/or diagnosis of SARS-CoV-2 by FDA under an Emergency Use Authorization (EUA). This EUA will remain in effect (meaning this test can be used) for the duration of the COVID-19 declaration under Section 564(b)(1) of the Act, 21 U.S.C. section 360bbb-3(b)(1), unless the authorization is terminated or revoked.     Resp Syncytial Virus by PCR NEGATIVE NEGATIVE    Comment: (NOTE) Fact Sheet for Patients: EntrepreneurPulse.com.au  Fact Sheet for Healthcare Providers: IncredibleEmployment.be  This test is not yet approved or cleared by the [place] FDA and has been authorized for detection and/or diagnosis of SARS-CoV-2 by FDA under an Emergency Use Authorization (EUA). This EUA will remain in effect (meaning this test can be used) for the duration of the COVID-19 declaration under Section 564(b)(1) of the Act, 21 U.S.C. section  360bbb-3(b)(1), unless the authorization is terminated or revoked.  Performed at [facility], [address] [place], [place] [address]   Lactic acid, plasma     Status: None   Collection Time: 07/19/22  6:40 PM  Result Value Ref Range   Lactic Acid, Venous 1.0 0.5 - 1.9 mmol/L    Comment: Performed at [facility], [address] [place], [place] [address]   DG Abdomen 1 View  Result Date: 07/19/2022 CLINICAL DATA:  Malaise and fatigue. EXAM: ABDOMEN - 1 VIEW COMPARISON:  Abdomen and pelvis CT, dated July 19, 2022 FINDINGS: A nasogastric tube is seen with its distal tip overlying the lateral aspect of the left upper quadrant. Left upper quadrant surgical sutures are also noted. Multiple dilated loops of air-filled small bowel are seen within the left upper quadrant and mid right abdomen. This corresponds to the findings seen on the earlier abdomen and pelvis CT. Radiopaque contrast is seen within the renal collecting systems and urinary bladder. IMPRESSION: 1. Findings consistent with a small-bowel obstruction. 2. Nasogastric tube positioning, as described above. Electronically Signed   By: Virgina Norfolk M.D.   On: 07/19/2022 22:23   CT Angio Head W or Wo Contrast  Result Date: 07/19/2022 CLINICAL DATA:  Headache, sudden, severe. EXAM: CT HEAD WITHOUT CONTRAST CT ANGIOGRAPHY OF THE HEAD TECHNIQUE: Contiguous axial images were obtained from the base of the skull through the vertex without intravenous contrast. Multidetector CT imaging of the head was performed using the standard protocol during bolus administration of intravenous contrast. 3D post processing, including multiplanar CT image reconstructions and MIPs were obtained to evaluate the vascular anatomy. RADIATION DOSE REDUCTION: This exam was performed according to the departmental dose-optimization program which includes automated exposure control, adjustment of the mA and/or kV according to patient size and/or  use of iterative reconstruction technique. CONTRAST:  185mL OMNIPAQUE IOHEXOL 350 MG/ML SOLN COMPARISON:  Head CT 04/02/2004. FINDINGS: CT HEAD Brain: No acute hemorrhage, mass effect or midline shift. Gray-white differentiation is preserved. No hydrocephalus. No extra-axial collection. Basilar cisterns are patent. Vascular: No hyperdense vessel or unexpected calcification. Skull: No calvarial fracture or suspicious bone lesion. Skull base is unremarkable. Sinuses/Orbits: Unremarkable. CTA HEAD Anterior circulation: Intracranial ICAs are patent without stenosis or  aneurysm. The proximal ACAs and MCAs are patent without stenosis or aneurysm. Distal branches are symmetric. Posterior circulation: Normal basilar artery. The SCAs, AICAs and PICAs are patent proximally. The PCAs are patent proximally without stenosis or aneurysm. Distal branches are symmetric. Venous sinuses: Patent. Anatomic variants: Hypoplastic left A1 segment. IMPRESSION: 1. No acute intracranial abnormality. 2. No large vessel occlusion, hemodynamically significant stenosis, or aneurysm of the head vessels. Electronically Signed   By: Emmit Alexanders M.D.   On: 07/19/2022 18:47   CT ABDOMEN PELVIS W CONTRAST  Result Date: 07/19/2022 CLINICAL DATA:  Abdominal pain. Postop hysterectomy. EXAM: CT ABDOMEN AND PELVIS WITH CONTRAST TECHNIQUE: Multidetector CT imaging of the abdomen and pelvis was performed using the standard protocol following bolus administration of intravenous contrast. RADIATION DOSE REDUCTION: This exam was performed according to the departmental dose-optimization program which includes automated exposure control, adjustment of the mA and/or kV according to patient size and/or use of iterative reconstruction technique. CONTRAST:  18mL OMNIPAQUE IOHEXOL 350 MG/ML SOLN COMPARISON:  CT 01/19/2012 FINDINGS: Lower chest: Clear lung bases. Hepatobiliary: Focal fatty infiltration adjacent to the falciform ligament. No suspicious liver  lesion. Cholecystectomy. Common bile duct measures 13 mm, normal tapering to the duodenum. There is no visualized choledocholithiasis. Pancreas: No ductal dilatation or inflammation. Spleen: Normal in size without focal abnormality. Adrenals/Urinary Tract: 8 x 15 mm right adrenal nodule, Hounsfield units of 88. No left adrenal nodule. No hydronephrosis. Two nonobstructing stones in the lower pole of the left kidney, larger measuring 4 mm homogeneous renal enhancement with symmetric excretion on delayed phase imaging. Unremarkable urinary bladder. Stomach/Bowel: Gastric bypass anatomy. Small hiatal hernia. The excluded gastric remnant is decompressed. The Roux limb is tortuous, markedly dilated with fecalization of contents with transition point involving a short segment of the Roux limb proximal to the jejunal anastomosis, series 3, image 44 and series 6, image 28. The jejunal anastomosis appears patulous. There is no other small bowel dilatation. The appendix is normal. Moderate volume of colonic stool. No definite colonic inflammation. Vascular/Lymphatic: No acute vascular findings. Normal caliber abdominal aorta. Patent portal, splenic and mesenteric veins. No bulky abdominopelvic adenopathy. Reproductive: Hysterectomy. Small to moderate volume of non organized free fluid in the dependent pelvis, no peripherally enhancing or drainable collection. The ovaries are not definitively seen, no evidence of adnexal mass. Other: Pelvic free fluid as described. No free intra-abdominal air. There is trace free fluid within the central upper abdomen. Musculoskeletal: T8 vertebral body hemangioma. Transitional lumbosacral anatomy. There are no acute or suspicious osseous abnormalities. IMPRESSION: 1. Gastric bypass anatomy. The Roux limb is tortuous, markedly dilated with fecalization of contents. Transition point involving a short segment of the Roux limb a few cm proximal to the jejunal anastomosis, may be due to an  adhesive band or stricture, but appears to be obstructed. Recommend surgical consultation. 2. Small to moderate volume of non organized free fluid in the dependent pelvis, no peripherally enhancing or drainable collection after recent hysterectomy. 3. Nonobstructing left nephrolithiasis. 4. Right adrenal nodule measuring 8 x 15 mm is indeterminate. This was described on prior outside CT from 2022, reportedly unchanged in size suggesting a benign adenoma. Electronically Signed   By: Keith Rake M.D.   On: 07/19/2022 18:11    Pending Labs [employer] (From admission, onward)     Start     Ordered   Signed and Held  HIV Antibody (routine testing w rflx)  (HIV Antibody (Routine testing w reflex) panel)  Once,  R        Signed and Held   Signed and Held  CBC  (enoxaparin (LOVENOX)    CrCl >/= 30 ml/min)  Once,   R       Comments: Baseline for enoxaparin therapy IF NOT ALREADY DRAWN.  Notify MD if PLT < 100 K.    Signed and Held   Signed and Held  Creatinine, serum  (enoxaparin (LOVENOX)    CrCl >/= 30 ml/min)  Once,   R       Comments: Baseline for enoxaparin therapy IF NOT ALREADY DRAWN.    Signed and Held   Signed and Held  Creatinine, serum  (enoxaparin (LOVENOX)    CrCl >/= 30 ml/min)  Weekly,   R     Comments: while on enoxaparin therapy    Signed and Held   Signed and Held  Basic metabolic panel  Tomorrow morning,   R        Signed and Held   Signed and Held  CBC  Tomorrow morning,   R        Signed and Held            Vitals/Pain Today's Vitals   07/19/22 2334 07/19/22 2338  BP:  124/69  Pulse:  69  Resp:  16  Temp:  98.2 F (36.8 C)  TempSrc:  Oral  SpO2:  95%  Weight: 95.3 kg   Height: 5\' 3"  (1.6 m)     Isolation Precautions No active isolations  Medications Medications  metoCLOPramide (REGLAN) injection 10 mg (10 mg Intravenous Given 07/20/22 0011)    Mobility walks     Focused Assessments   R Recommendations: See Admitting Provider  Note  Report given to:   Additional Notes:

## 2022-07-20 NOTE — Op Note (Signed)
   Patient: Paula Mccormick (11/04/1970, [id number])  Date of Surgery: 07/20/22  Preoperative Diagnosis: SMALL BOWEL OBSTRUCTION, HISTORY OF GASTRIC BYPASS  Postoperative Diagnosis: SMALL BOWEL OBSTRUCTION FROM INTERNAL HERNIA, HISTORY OF ANTECOLIC GASTRIC BYPASS  Surgical Procedure: LAPAROSCOPIC REDUCTION OF INTERNAL HERNIA, LYSIS OF ADHESIONS AND CLOSURE OF MESENTERIC DEFECT  Operative Team Members:  Surgeon(s) and Role:    Leighton Ruff, MD Co-Surgeon    * Leonia Heatherly, Nickola Major, MD  Co-Surgeon  Anesthesiologist: Myrtie Soman, MD CRNA: Lind Covert, CRNA; Lavina Hamman, CRNA   Anesthesia: General   Fluids:  No intake/output data recorded.  Complications: * No complications entered in OR log *  Drains:  none   Specimen: * No specimens in log *   Disposition:  PACU - hemodynamically stable.  Plan of Care: Admit to inpatient     Indications for Procedure: Paula Mccormick is a 52 y.o. female who presented with a bowel obstruction.  She has a history of a gastric bypass.  Dr. Marcello Moores took her to the operating room for exploration and asked me to assist for bariatric surgery evaluation and expertise.  Findings: Inter-loop adhesion between roux limb and omental adhesion to abdominal wall lysed by Dr. Marcello Moores.  Jejunojejunostomy mesenteric defect was closed on inspection.  Retro-roux defect was open on inspection so I closed it with a running silk suture.   Description of Procedure:   I joined the case and Dr. Marcello Moores had already begun.  I was asked to lend bariatric expertise to evaluate for cause of bowel obstruction and this gastric bypass patient.  Please read Dr. Manon Hilding dictation for details on the other adhesions found intra-abdominal he.  I ran the small intestine from the terminal ileum to the jejunojejunostomy.  It did seem to untwist, however I was not able to visualize a defect as I was on twisting things.  The jejunojejunostomy was inspected and the  mesenteric defect was closed on inspection.  I continue to follow the Roux limb up to the gastric pouch.  The retro-Roux defect between the jejunal mesentery and the transverse colon mesentery was open.  This was closed using running 2-0 silk suture.  The hepatobiliary limb was also inspected.  There were no other abnormalities in the abdomen.  We directed our attention to closure.  Please see Dr. Manon Hilding note for more details.  At the end of the case we reviewed the infection status of the case. Patient: Paula Mccormick Emergency General Surgery Service Patient Case: Urgent Infection Present At Time Of Surgery (PATOS): None  Paula Raw, MD General, Bariatric, & Minimally Invasive Surgery [facility], [place]

## 2022-07-20 NOTE — Anesthesia Procedure Notes (Signed)
Procedure Name: Intubation Date/Time: 07/20/2022 10:15 AM  Performed by: Lind Covert, CRNAPre-anesthesia Checklist: Patient identified, Emergency Drugs available, Suction available, Patient being monitored and Timeout performed Patient Re-evaluated:Patient Re-evaluated prior to induction Oxygen Delivery Method: Circle system utilized Preoxygenation: Pre-oxygenation with 100% oxygen Induction Type: IV induction Ventilation: Mask ventilation without difficulty Laryngoscope Size: Mac and 3 Grade View: Grade I Tube type: Oral Tube size: 7.0 mm Number of attempts: 1 Airway Equipment and Method: Stylet Placement Confirmation: ETT inserted through vocal cords under direct vision, positive ETCO2 and breath sounds checked- equal and bilateral Secured at: 21 cm Tube secured with: Tape Dental Injury: Teeth and Oropharynx as per pre-operative assessment

## 2022-07-20 NOTE — Anesthesia Preprocedure Evaluation (Signed)
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  She presented to the emergency department at [facility] this afternoon with abdominal pain that began after moving furniture the day prior.  This is periumbilical.  Associated with 1 episode of emesis, en route.  Also noted a worsening headache with some photophobia. Felt something pop and then developed acute mid to upper abdominal pain. No f/c. Takes oxycodone 15mg  tid and pain doctor recently added extra 5mg  at night - under pain contract.  Reviewed: Allergy & Precautions, H&P , NPO status , Patient's Chart, lab work & pertinent test results  Airway Mallampati: II  TM Distance: >3 FB Neck ROM: Full    Dental no notable dental hx.    Pulmonary neg pulmonary ROS   Pulmonary exam normal breath sounds clear to auscultation       Cardiovascular hypertension, Normal cardiovascular exam Rhythm:Regular Rate:Normal     Neuro/Psych Chronic oxy use  negative psych ROS   GI/Hepatic negative GI ROS, Neg liver ROS,,,  Endo/Other  negative endocrine ROS    Renal/GU negative Renal ROS  negative genitourinary   Musculoskeletal negative musculoskeletal ROS (+)    Abdominal   Peds negative pediatric ROS (+)  Hematology negative hematology ROS (+)   Anesthesia Other Findings   Reproductive/Obstetrics negative OB ROS                             Anesthesia Physical Anesthesia Plan  ASA: 2  Anesthesia Plan: General   Post-op Pain Management: Lidocaine infusion*, Toradol IV  (intra-op)* and Ketamine IV*   Induction: Intravenous and Rapid sequence  PONV Risk Score and Plan: 3 and Ondansetron, Dexamethasone, Droperidol, Treatment may vary due to age or medical condition and Midazolam  Airway Management Planned: Oral ETT  Additional Equipment:   Intra-op Plan:   Post-operative Plan: Extubation in OR  Informed Consent: I have reviewed the patients History and Physical, chart, labs and discussed the procedure including the risks, benefits and alternatives for the proposed anesthesia with the patient or authorized representative who has indicated his/her understanding and acceptance.     Dental advisory given  Plan Discussed with: CRNA and Surgeon  Anesthesia Plan Comments:        Anesthesia Quick Evaluation

## 2022-07-20 NOTE — H&P (Signed)
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  She presented to the emergency department at [facility] this afternoon with abdominal pain that began after moving furniture the day prior.  This is periumbilical.  Associated with 1 episode of emesis, en route.  Also noted a worsening headache with some photophobia. Felt something pop and then developed acute mid to upper abdominal pain. No f/c. Takes oxycodone 15mg  tid and pain doctor recently added extra 5mg  at night - under pain contract.  Patient also developed a headache.  Takes bariatric mvi and ca.  Denies NSAID use  Unsure last BM - perhaps yesterday  She was noted to have fairly diffuse abdominal tenderness without peritoneal signs, normal vital signs and normal labs including WBC of 5.4, creatinine of 0.82, normal bicarb, normal lactate.  She underwent a CT angio of her head which was negative and a CT of her abdomen and pelvis confirming gastric bypass anatomy with a tortuous Roux limb, markedly dilated with fecalization of contents and a transition point involving a short segment of the Roux limb just proximal to the jejunojejunostomy.  Small to moderate volume of simple free fluid in the dependent pelvis without peripheral enhancement or drainable collection after recent hysterectomy, nonobstructing left kidney stone and a small indeterminate left adrenal nodule which is unchanged compared to CT 2 years ago.  I have reviewed the images and report personally. Prior Cts since her bypass (reports only) reviewed; do not note any evidence of obstruction.   On review of prior records in [facility], she has been followed  at [facility] for chronic diarrhea, bloating and distention worse after eating, by GI, since her bypass. Reports regular daily bowel movements and prior nutritional labs have been normal reportedly. Had an unrevealing EGD last year. She was diagnosed with bacterial overgrowth syndrome and treated at last visit in October, and had a sitz marker study ordered. SBFT was considered.   An attempt was made by the EDP to transfer the patient to [facility] where her GI and surgeon are, but they are on diversion.     Allergies  Allergen Reactions   Hydrochlorothiazide Other (See Comments)    Severe dehydration Severe dehydration    Lisinopril Other (See Comments)    Other reaction(s): Other (See Comments)   Morphine And Related Hives   Other Hives   Morphine Itching and Rash    Past Medical History:  Diagnosis Date   Essential hypertension    Kidney stones     Past Surgical History:  Procedure Laterality Date   ABDOMINAL HYSTERECTOMY     GALLBLADDER SURGERY     KNEE ARTHROSCOPY      Family History  Problem Relation Age of Onset   Kidney failure Father        on dialysis   Heart attack Sister    Stroke Brother    Chronic Renal Failure Mother     Social History   Socioeconomic History   Marital status: Married    Spouse name: Not on file   Number of children: Not on file   Years of education: Not on file   Highest education level: Not on file  Occupational History   Not on file  Tobacco Use   Smoking  status: Never   Smokeless tobacco: Never  Substance and Sexual Activity   Alcohol use: No   Drug use: No   Sexual activity: Yes    Birth control/protection: None  Other Topics Concern   Not on file  Social History Narrative   Not on file   Social Determinants of Health   Financial Resource Strain: Not on file  Food Insecurity: Not on file  Transportation Needs: Not on file  Physical Activity: Not on file  Stress: Not on file  Social Connections: Not on file    No current  facility-administered medications on file prior to encounter.   Current Outpatient Medications on File Prior to Encounter  Medication Sig Dispense Refill   metoprolol succinate (TOPROL-XL) 25 MG 24 hr tablet Take 1 tablet (25 mg total) by mouth daily. 30 tablet 1   omeprazole (PRILOSEC) 40 MG capsule Take 1 capsule (40 mg total) by mouth daily. 30 capsule 0    Review of Systems: a complete, 10pt review of systems was completed with pertinent positives and negatives as documented in the HPI  Physical Exam: Vitals:   07/19/22 2338  BP: 124/69  Pulse: 69  Resp: 16  Temp: 98.2 F (36.8 C)  SpO2: 95%   Gen: A&Ox3, no distress, a little drowsy toward end of our conversation Eyes: lids and conjunctivae normal, no icterus. Pupils equally round and reactive to light.  Neck: supple without mass or thyromegaly Chest: respiratory effort is normal. No crepitus or tenderness on palpation of the chest. Breath sounds equal.  Cardiovascular: RRR with palpable distal pulses, no pedal edema Gastrointestinal: soft, nondistended, min tender. No mass, hepatomegaly or splenomegaly. No hernia. Old trocar scars. Definitely no rebound/guarding Lymphatic: no lymphadenopathy in the neck or groin Muscoloskeletal: no clubbing or cyanosis of the fingers.  Strength is symmetrical throughout.  Range of motion of bilateral upper and lower extremities normal without pain, crepitation or contracture. Neuro: cranial nerves grossly intact.  Sensation intact to light touch diffusely. Psych: appropriate mood and affect, normal insight/judgment intact  Skin: warm and dry      Latest Ref Rng & Units 07/19/2022    3:24 PM 01/16/2018    8:27 PM 12/28/2015   11:47 PM  CBC  WBC 4.0 - 10.5 K/uL 5.4  4.6  11.6   Hemoglobin 12.0 - 15.0 g/dL 13.4  14.9  16.3   Hematocrit 36.0 - 46.0 % 41.6  42.5  48.5   Platelets 150 - 400 K/uL 278  298  322        Latest Ref Rng & Units 07/19/2022    3:24 PM 01/16/2018    8:27 PM  12/30/2015    3:06 AM  CMP  Glucose 70 - 99 mg/dL 120  97  93   BUN 6 - 20 mg/dL 19  19  24    Creatinine 0.44 - 1.00 mg/dL 0.82  0.82  1.19   Sodium 135 - 145 mmol/L 137  138  139   Potassium 3.5 - 5.1 mmol/L 4.2  3.9  4.0   Chloride 98 - 111 mmol/L 106  108  111   CO2 22 - 32 mmol/L 25  24  25    Calcium 8.9 - 10.3 mg/dL 8.9  8.8  7.9   Total Protein 6.5 - 8.1 g/dL 7.2  7.6    Total Bilirubin 0.3 - 1.2 mg/dL 0.7  0.7    Alkaline Phos 38 - 126 U/L 109  91    AST 15 -  41 U/L 24  19    ALT 0 - 44 U/L 20  16      No results found for: "INR", "PROTIME"  Imaging: DG Abdomen 1 View  Result Date: 07/19/2022 CLINICAL DATA:  Malaise and fatigue. EXAM: ABDOMEN - 1 VIEW COMPARISON:  Abdomen and pelvis CT, dated July 19, 2022 FINDINGS: A nasogastric tube is seen with its distal tip overlying the lateral aspect of the left upper quadrant. Left upper quadrant surgical sutures are also noted. Multiple dilated loops of air-filled small bowel are seen within the left upper quadrant and mid right abdomen. This corresponds to the findings seen on the earlier abdomen and pelvis CT. Radiopaque contrast is seen within the renal collecting systems and urinary bladder. IMPRESSION: 1. Findings consistent with a small-bowel obstruction. 2. Nasogastric tube positioning, as described above. Electronically Signed   By: Virgina Norfolk M.D.   On: 07/19/2022 22:23   CT Angio Head W or Wo Contrast  Result Date: 07/19/2022 CLINICAL DATA:  Headache, sudden, severe. EXAM: CT HEAD WITHOUT CONTRAST CT ANGIOGRAPHY OF THE HEAD TECHNIQUE: Contiguous axial images were obtained from the base of the skull through the vertex without intravenous contrast. Multidetector CT imaging of the head was performed using the standard protocol during bolus administration of intravenous contrast. 3D post processing, including multiplanar CT image reconstructions and MIPs were obtained to evaluate the vascular anatomy. RADIATION DOSE REDUCTION:  This exam was performed according to the departmental dose-optimization program which includes automated exposure control, adjustment of the mA and/or kV according to patient size and/or use of iterative reconstruction technique. CONTRAST:  176mL OMNIPAQUE IOHEXOL 350 MG/ML SOLN COMPARISON:  Head CT 04/02/2004. FINDINGS: CT HEAD Brain: No acute hemorrhage, mass effect or midline shift. Gray-white differentiation is preserved. No hydrocephalus. No extra-axial collection. Basilar cisterns are patent. Vascular: No hyperdense vessel or unexpected calcification. Skull: No calvarial fracture or suspicious bone lesion. Skull base is unremarkable. Sinuses/Orbits: Unremarkable. CTA HEAD Anterior circulation: Intracranial ICAs are patent without stenosis or aneurysm. The proximal ACAs and MCAs are patent without stenosis or aneurysm. Distal branches are symmetric. Posterior circulation: Normal basilar artery. The SCAs, AICAs and PICAs are patent proximally. The PCAs are patent proximally without stenosis or aneurysm. Distal branches are symmetric. Venous sinuses: Patent. Anatomic variants: Hypoplastic left A1 segment. IMPRESSION: 1. No acute intracranial abnormality. 2. No large vessel occlusion, hemodynamically significant stenosis, or aneurysm of the head vessels. Electronically Signed   By: Emmit Alexanders M.D.   On: 07/19/2022 18:47   CT ABDOMEN PELVIS W CONTRAST  Result Date: 07/19/2022 CLINICAL DATA:  Abdominal pain. Postop hysterectomy. EXAM: CT ABDOMEN AND PELVIS WITH CONTRAST TECHNIQUE: Multidetector CT imaging of the abdomen and pelvis was performed using the standard protocol following bolus administration of intravenous contrast. RADIATION DOSE REDUCTION: This exam was performed according to the departmental dose-optimization program which includes automated exposure control, adjustment of the mA and/or kV according to patient size and/or use of iterative reconstruction technique. CONTRAST:  177mL OMNIPAQUE  IOHEXOL 350 MG/ML SOLN COMPARISON:  CT 01/19/2012 FINDINGS: Lower chest: Clear lung bases. Hepatobiliary: Focal fatty infiltration adjacent to the falciform ligament. No suspicious liver lesion. Cholecystectomy. Common bile duct measures 13 mm, normal tapering to the duodenum. There is no visualized choledocholithiasis. Pancreas: No ductal dilatation or inflammation. Spleen: Normal in size without focal abnormality. Adrenals/Urinary Tract: 8 x 15 mm right adrenal nodule, Hounsfield units of 88. No left adrenal nodule. No hydronephrosis. Two nonobstructing stones in the lower pole of the left  kidney, larger measuring 4 mm homogeneous renal enhancement with symmetric excretion on delayed phase imaging. Unremarkable urinary bladder. Stomach/Bowel: Gastric bypass anatomy. Small hiatal hernia. The excluded gastric remnant is decompressed. The Roux limb is tortuous, markedly dilated with fecalization of contents with transition point involving a short segment of the Roux limb proximal to the jejunal anastomosis, series 3, image 44 and series 6, image 28. The jejunal anastomosis appears patulous. There is no other small bowel dilatation. The appendix is normal. Moderate volume of colonic stool. No definite colonic inflammation. Vascular/Lymphatic: No acute vascular findings. Normal caliber abdominal aorta. Patent portal, splenic and mesenteric veins. No bulky abdominopelvic adenopathy. Reproductive: Hysterectomy. Small to moderate volume of non organized free fluid in the dependent pelvis, no peripherally enhancing or drainable collection. The ovaries are not definitively seen, no evidence of adnexal mass. Other: Pelvic free fluid as described. No free intra-abdominal air. There is trace free fluid within the central upper abdomen. Musculoskeletal: T8 vertebral body hemangioma. Transitional lumbosacral anatomy. There are no acute or suspicious osseous abnormalities. IMPRESSION: 1. Gastric bypass anatomy. The Roux limb is  tortuous, markedly dilated with fecalization of contents. Transition point involving a short segment of the Roux limb a few cm proximal to the jejunal anastomosis, may be due to an adhesive band or stricture, but appears to be obstructed. Recommend surgical consultation. 2. Small to moderate volume of non organized free fluid in the dependent pelvis, no peripherally enhancing or drainable collection after recent hysterectomy. 3. Nonobstructing left nephrolithiasis. 4. Right adrenal nodule measuring 8 x 15 mm is indeterminate. This was described on prior outside CT from 2022, reportedly unchanged in size suggesting a benign adenoma. Electronically Signed   By: Keith Rake M.D.   On: 07/19/2022 18:11       A/P: 52yo woman with history of roux en y gastric bypass just over 3 years ago at [facility]. She has been having symptoms of bloating, nausea, diarrhea for some time now, but previous Cts have been unrevealing.  She reports acute onset of abdominal pain after helping move furniture on Monday.  She now has obstruction of her roux limb just proximal to the JJ.   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.  I connected it to wall suction here.  I immediately got out about 150 cc of light green fluid.  Patient is nontoxic-appearing.  She is resting comfortably.  She is afebrile.  She is not tachycardic.  She does not have leukocytosis.  There is no evidence of bowel wall thickening, pneumatosis or portal venous gas on imaging.  I do not believe she needs urgent surgical intervention at this moment  However patient will need operative intervention which we will plan later today during the day shift. Patient will need diagnostic laparoscopy possible exploratory laparotomy.  CT is more suggestive of an adhesive band causing an obstruction.  Possible patient  could also have an internal hernia due to a permanent suture breaking down that was closing the mesenteric defect but does not have the typical internal hernia appearance  Admit to outpatient observation IV fluids Bowel rest, NG tube to low intermittent wall suction Pain control Chemical VTE prophylaxis Avoid NSAIDs   Will discuss more with patient regarding surgery in the morning.  The patient actually fell asleep toward the end of our conversation.  Will discuss with day team in the  morning.  High level of medical decision making, extensive medical data review, decision regarding hospitalization and need for surgery  Data reviewed: I reviewed EDP note from [place], CT scans from March 19, CT report of abdomen pelvis from May 2022, Dr. Gennaro Africa operative note from December 2020, family medicine progress note regarding her chronic pain from June 27, 2022 and March 17, 2022, postoperative clinic note from OB/GYN from June 15, 2022, vitals for 6 hours; discussed case with Dr. Kae Heller  Patient Active Problem List   Diagnosis Date Noted   SBO (small bowel obstruction) ([place]) 07/19/2022   AKI (acute kidney injury) ([place]) 12/29/2015   Chest pain with moderate risk for cardiac etiology 12/29/2015   Essential hypertension    Acute renal failure ([place])    Dehydration    SOB (shortness of breath)        Leighton Ruff. Redmond Pulling, MD, FACS General, Bariatric, & Minimally Invasive Surgery [facility] Surgery,  [place] to contact appropriate on-call provider

## 2022-07-20 NOTE — Op Note (Signed)
07/19/2022 - 07/20/2022  11:39 AM  PATIENT:  Paula Mccormick  52 y.o. female  Patient Care Team: Pcp, No as PCP - General  PRE-OPERATIVE DIAGNOSIS:  SMALL BOWEL OBSTRUCTION, HISTORY OF GASTRIC BYPASS  POST-OPERATIVE DIAGNOSIS:  SMALL BOWEL OBSTRUCTION,HISTORY OF GASTRIC BYPASS  PROCEDURE:  DIAGNOSTIC LAPAROSCOPY, REDUCTION OF INTERNAL HERNIA, LYSIS OF ADHESIONS, CLOSURE OF MESENTERIC DEFECT  Co-Surgeon(s): Stechschulte, Nickola Major, MD Leighton Ruff, MD  ASSISTANT: none   ANESTHESIA:   local and general  EBL: 52ml Total I/O In: 1000 [I.V.:1000] Out: -   DRAINS: none   SPECIMEN:  No Specimen  DISPOSITION OF SPECIMEN:  N/A  COUNTS:  YES  PLAN OF CARE:  Patient already admitted  PATIENT DISPOSITION:  PACU - hemodynamically stable.  INDICATION: 52 y.o. F with h/o antecolic gastric bypass at [facility] in 2024 presented to ED with acute onset of abdominal pain.  CT concerning for Roux limb obstruction.     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  Colonic mesenteric defect and significant interloop adhesion that appeared to be the cause of the hernia.  DESCRIPTION: the patient was identified in the preoperative holding area and taken to the OR where they were laid supine on the operating room table.  General anesthesia was induced without difficulty. SCDs were also noted to be in place prior to the initiation of anesthesia.  The patient was then prepped and draped in the usual sterile fashion.   A surgical timeout was performed indicating the correct patient, procedure, positioning and need for preoperative antibiotics.  I began by making a supraumbilical incision using a 15 blade scalpel.  This was carried down through subcutaneous tissues using blunt dissection.  The fascia was identified at midline and elevated.  An incision was made in the fascia using the scalpel.  Blunt dissection with a Kelly clamp was used to enter the peritoneum.  There was no sign of injury  to underlying structures.  I placed a 0 Vicryl pursestring suture and a Hassan port into the abdomen.  We then insufflated with carbon dioxide to approximately 10 mmHg.  At this point a camera was introduced and additional 5 mm ports were placed in the lower abdomen under direct laparoscopic visualization.  We evaluated the abdomen.  There was no sign of significantly inflamed or ischemic bowel.  We began by running the small bowel from the terminal ileum proximally.  There appeared to be an internal hernia with a bowel herniated underneath the mesentery.  This was slowly reduced.  We came to the JJ anastomosis.  There was a dense interloop adhesion noted in this area that appeared to be twisting the JJ anastomosis and possibly causing obstructive symptomatology.  This was lysed with laparoscopic scissors.  We then evaluated the remaining limbs and found a defect in the colon mesentery as well.  At this point Dr. Thermon Leyland was asked to evaluate, as he is a Ambulance person.  He felt that this defect should be closed.  This was done laparoscopically using 2-0 silk sutures and will be dictated separately.  Once this was completed the remainder of the abdomen was evaluated.  There was no other signs of injury.  The gallbladder was surgically absent.  The appendix appeared normal.  Hemostasis was good.  The ports were removed and the abdomen was desufflated.  The fascia of the umbilical incision was closed using the pursestring suture.  An additional 2-0 Vicryl suture was used to close the subcutaneous tissue and running 4-0  subcuticular sutures were used to close the incisions.  The remaining port sites were closed using interrupted 4-0 Vicryl sutures and Dermabond.  The patient was then awakened from anesthesia and sent to the postanesthesia care unit in stable condition.  All counts were correct per operating room staff.  Rosario Adie, MD  Colorectal and [place] [facility]

## 2022-07-21 ENCOUNTER — Encounter (HOSPITAL_COMMUNITY): Payer: Self-pay | Admitting: General Surgery

## 2022-07-21 DIAGNOSIS — Z79899 Other long term (current) drug therapy: Secondary | ICD-10-CM | POA: Diagnosis not present

## 2022-07-21 DIAGNOSIS — Z87442 Personal history of urinary calculi: Secondary | ICD-10-CM | POA: Diagnosis not present

## 2022-07-21 DIAGNOSIS — K9189 Other postprocedural complications and disorders of digestive system: Secondary | ICD-10-CM | POA: Diagnosis not present

## 2022-07-21 DIAGNOSIS — Y838 Other surgical procedures as the cause of abnormal reaction of the patient, or of later complication, without mention of misadventure at the time of the procedure: Secondary | ICD-10-CM | POA: Diagnosis present

## 2022-07-21 DIAGNOSIS — Z885 Allergy status to narcotic agent status: Secondary | ICD-10-CM | POA: Diagnosis not present

## 2022-07-21 DIAGNOSIS — I10 Essential (primary) hypertension: Secondary | ICD-10-CM | POA: Diagnosis not present

## 2022-07-21 DIAGNOSIS — K660 Peritoneal adhesions (postprocedural) (postinfection): Secondary | ICD-10-CM | POA: Diagnosis not present

## 2022-07-21 DIAGNOSIS — Z9884 Bariatric surgery status: Secondary | ICD-10-CM | POA: Diagnosis not present

## 2022-07-21 DIAGNOSIS — Z841 Family history of disorders of kidney and ureter: Secondary | ICD-10-CM | POA: Diagnosis not present

## 2022-07-21 DIAGNOSIS — R519 Headache, unspecified: Secondary | ICD-10-CM | POA: Diagnosis not present

## 2022-07-21 DIAGNOSIS — K458 Other specified abdominal hernia without obstruction or gangrene: Secondary | ICD-10-CM | POA: Diagnosis present

## 2022-07-21 DIAGNOSIS — K567 Ileus, unspecified: Secondary | ICD-10-CM | POA: Diagnosis not present

## 2022-07-21 DIAGNOSIS — K529 Noninfective gastroenteritis and colitis, unspecified: Secondary | ICD-10-CM | POA: Diagnosis not present

## 2022-07-21 DIAGNOSIS — K9589 Other complications of other bariatric procedure: Secondary | ICD-10-CM | POA: Diagnosis not present

## 2022-07-21 DIAGNOSIS — Z888 Allergy status to other drugs, medicaments and biological substances status: Secondary | ICD-10-CM | POA: Diagnosis not present

## 2022-07-21 DIAGNOSIS — X500XXA Overexertion from strenuous movement or load, initial encounter: Secondary | ICD-10-CM | POA: Diagnosis not present

## 2022-07-21 DIAGNOSIS — Z8249 Family history of ischemic heart disease and other diseases of the circulatory system: Secondary | ICD-10-CM | POA: Diagnosis not present

## 2022-07-21 DIAGNOSIS — G894 Chronic pain syndrome: Secondary | ICD-10-CM | POA: Diagnosis not present

## 2022-07-21 DIAGNOSIS — Z823 Family history of stroke: Secondary | ICD-10-CM | POA: Diagnosis not present

## 2022-07-21 DIAGNOSIS — K56609 Unspecified intestinal obstruction, unspecified as to partial versus complete obstruction: Secondary | ICD-10-CM | POA: Diagnosis not present

## 2022-07-21 DIAGNOSIS — K460 Unspecified abdominal hernia with obstruction, without gangrene: Secondary | ICD-10-CM | POA: Diagnosis not present

## 2022-07-21 DIAGNOSIS — D509 Iron deficiency anemia, unspecified: Secondary | ICD-10-CM | POA: Diagnosis not present

## 2022-07-21 NOTE — Progress Notes (Signed)
1 Day Post-Op lap LOA Subjective: Having a lot of abd pain, no nausea, passing flatus  Objective: Vital signs in last 24 hours: Temp:  [97.5 F (36.4 C)-98.2 F (36.8 C)] 97.6 F (36.4 C) (03/21 [id number]) Pulse Rate:  [52-74] 52 (03/21 0613) Resp:  [12-20] 16 (03/21 0613) BP: (107-142)/(64-81) 142/70 (03/21 0613) SpO2:  [94 %-100 %] 98 % (03/21 0613)   Intake/Output from previous day: 03/20 0701 - 03/21 0700 In: 2421.3 [I.V.:2421.3] Out: 805 [Urine:575; Emesis/NG output:200; Blood:30] Intake/Output this shift: No intake/output data recorded.   General appearance: alert and cooperative GI: soft, non-distended, NG output clear and min  Incision: no significant drainage  Lab Results:  Recent Labs    07/19/22 1524 07/20/22 0506  WBC 5.4 11.8*  HGB 13.4 12.1  HCT 41.6 38.0  PLT 278 210   BMET Recent Labs    07/19/22 1524 07/20/22 0506  NA 137 137  K 4.2 3.8  CL 106 107  CO2 25 23  GLUCOSE 120* 126*  BUN 19 22*  CREATININE 0.82 0.86  CALCIUM 8.9 8.7*   PT/INR No results for input(s): "LABPROT", "INR" in the last 72 hours. ABG No results for input(s): "PHART", "HCO3" in the last 72 hours.  Invalid input(s): "PCO2", "PO2"  MEDS, Scheduled  enoxaparin (LOVENOX) injection  40 mg Subcutaneous Q24H   pantoprazole (PROTONIX) IV  40 mg Intravenous QHS    Studies/Results: DG Abdomen 1 View  Result Date: 07/19/2022 CLINICAL DATA:  Malaise and fatigue. EXAM: ABDOMEN - 1 VIEW COMPARISON:  Abdomen and pelvis CT, dated July 19, 2022 FINDINGS: A nasogastric tube is seen with its distal tip overlying the lateral aspect of the left upper quadrant. Left upper quadrant surgical sutures are also noted. Multiple dilated loops of air-filled small bowel are seen within the left upper quadrant and mid right abdomen. This corresponds to the findings seen on the earlier abdomen and pelvis CT. Radiopaque contrast is seen within the renal collecting systems and urinary bladder.  IMPRESSION: 1. Findings consistent with a small-bowel obstruction. 2. Nasogastric tube positioning, as described above. Electronically Signed   By: Virgina Norfolk M.D.   On: 07/19/2022 22:23   CT Angio Head W or Wo Contrast  Result Date: 07/19/2022 CLINICAL DATA:  Headache, sudden, severe. EXAM: CT HEAD WITHOUT CONTRAST CT ANGIOGRAPHY OF THE HEAD TECHNIQUE: Contiguous axial images were obtained from the base of the skull through the vertex without intravenous contrast. Multidetector CT imaging of the head was performed using the standard protocol during bolus administration of intravenous contrast. 3D post processing, including multiplanar CT image reconstructions and MIPs were obtained to evaluate the vascular anatomy. RADIATION DOSE REDUCTION: This exam was performed according to the departmental dose-optimization program which includes automated exposure control, adjustment of the mA and/or kV according to patient size and/or use of iterative reconstruction technique. CONTRAST:  149mL OMNIPAQUE IOHEXOL 350 MG/ML SOLN COMPARISON:  Head CT 04/02/2004. FINDINGS: CT HEAD Brain: No acute hemorrhage, mass effect or midline shift. Gray-white differentiation is preserved. No hydrocephalus. No extra-axial collection. Basilar cisterns are patent. Vascular: No hyperdense vessel or unexpected calcification. Skull: No calvarial fracture or suspicious bone lesion. Skull base is unremarkable. Sinuses/Orbits: Unremarkable. CTA HEAD Anterior circulation: Intracranial ICAs are patent without stenosis or aneurysm. The proximal ACAs and MCAs are patent without stenosis or aneurysm. Distal branches are symmetric. Posterior circulation: Normal basilar artery. The SCAs, AICAs and PICAs are patent proximally. The PCAs are patent proximally without stenosis or aneurysm. Distal branches are symmetric.  Venous sinuses: Patent. Anatomic variants: Hypoplastic left A1 segment. IMPRESSION: 1. No acute intracranial abnormality. 2. No  large vessel occlusion, hemodynamically significant stenosis, or aneurysm of the head vessels. Electronically Signed   By: Emmit Alexanders M.D.   On: 07/19/2022 18:47   CT ABDOMEN PELVIS W CONTRAST  Result Date: 07/19/2022 CLINICAL DATA:  Abdominal pain. Postop hysterectomy. EXAM: CT ABDOMEN AND PELVIS WITH CONTRAST TECHNIQUE: Multidetector CT imaging of the abdomen and pelvis was performed using the standard protocol following bolus administration of intravenous contrast. RADIATION DOSE REDUCTION: This exam was performed according to the departmental dose-optimization program which includes automated exposure control, adjustment of the mA and/or kV according to patient size and/or use of iterative reconstruction technique. CONTRAST:  179mL OMNIPAQUE IOHEXOL 350 MG/ML SOLN COMPARISON:  CT 01/19/2012 FINDINGS: Lower chest: Clear lung bases. Hepatobiliary: Focal fatty infiltration adjacent to the falciform ligament. No suspicious liver lesion. Cholecystectomy. Common bile duct measures 13 mm, normal tapering to the duodenum. There is no visualized choledocholithiasis. Pancreas: No ductal dilatation or inflammation. Spleen: Normal in size without focal abnormality. Adrenals/Urinary Tract: 8 x 15 mm right adrenal nodule, Hounsfield units of 88. No left adrenal nodule. No hydronephrosis. Two nonobstructing stones in the lower pole of the left kidney, larger measuring 4 mm homogeneous renal enhancement with symmetric excretion on delayed phase imaging. Unremarkable urinary bladder. Stomach/Bowel: Gastric bypass anatomy. Small hiatal hernia. The excluded gastric remnant is decompressed. The Roux limb is tortuous, markedly dilated with fecalization of contents with transition point involving a short segment of the Roux limb proximal to the jejunal anastomosis, series 3, image 44 and series 6, image 28. The jejunal anastomosis appears patulous. There is no other small bowel dilatation. The appendix is normal. Moderate  volume of colonic stool. No definite colonic inflammation. Vascular/Lymphatic: No acute vascular findings. Normal caliber abdominal aorta. Patent portal, splenic and mesenteric veins. No bulky abdominopelvic adenopathy. Reproductive: Hysterectomy. Small to moderate volume of non organized free fluid in the dependent pelvis, no peripherally enhancing or drainable collection. The ovaries are not definitively seen, no evidence of adnexal mass. Other: Pelvic free fluid as described. No free intra-abdominal air. There is trace free fluid within the central upper abdomen. Musculoskeletal: T8 vertebral body hemangioma. Transitional lumbosacral anatomy. There are no acute or suspicious osseous abnormalities. IMPRESSION: 1. Gastric bypass anatomy. The Roux limb is tortuous, markedly dilated with fecalization of contents. Transition point involving a short segment of the Roux limb a few cm proximal to the jejunal anastomosis, may be due to an adhesive band or stricture, but appears to be obstructed. Recommend surgical consultation. 2. Small to moderate volume of non organized free fluid in the dependent pelvis, no peripherally enhancing or drainable collection after recent hysterectomy. 3. Nonobstructing left nephrolithiasis. 4. Right adrenal nodule measuring 8 x 15 mm is indeterminate. This was described on prior outside CT from 2022, reportedly unchanged in size suggesting a benign adenoma. Electronically Signed   By: Keith Rake M.D.   On: 07/19/2022 18:11    Assessment: s/p Procedure(s): DIAGNOSTIC LAPAROSCOPY, LYSIS OF ADHESIONS, CLOSURE OF MESENTERIC DEFECT Patient Active Problem List   Diagnosis Date Noted   SBO (small bowel obstruction) ([facility]) 07/19/2022   AKI (acute kidney injury) ([place]) 12/29/2015   Chest pain with moderate risk for cardiac etiology 12/29/2015   Essential hypertension    Acute renal failure (HCC)    Dehydration    SOB (shortness of breath)     Expected post op course  Plan: D/c  NG  but keep NPO Ambulate in hall Cont PO pain regimen  LOS: 0 days     .Rosario Adie, [facility], [place]    07/21/2022 8:47 AM

## 2022-07-22 ENCOUNTER — Encounter (HOSPITAL_COMMUNITY): Payer: Self-pay

## 2022-07-22 MED ORDER — METHOCARBAMOL 500 MG PO TABS
750.0000 mg | ORAL_TABLET | Freq: Three times a day (TID) | ORAL | Status: DC
  Administered 2022-07-22 – 2022-07-26 (×14): 750 mg via ORAL
  Filled 2022-07-22 (×14): qty 2

## 2022-07-22 MED ORDER — DULOXETINE HCL 60 MG PO CPEP
60.0000 mg | ORAL_CAPSULE | Freq: Every day | ORAL | Status: DC
  Administered 2022-07-22 – 2022-07-26 (×5): 60 mg via ORAL
  Filled 2022-07-22 (×6): qty 1

## 2022-07-22 MED ORDER — DOCUSATE SODIUM 100 MG PO CAPS
100.0000 mg | ORAL_CAPSULE | Freq: Every day | ORAL | Status: DC
  Administered 2022-07-22 – 2022-07-26 (×5): 100 mg via ORAL
  Filled 2022-07-22 (×5): qty 1

## 2022-07-22 MED ORDER — ACETAMINOPHEN 500 MG PO TABS
1000.0000 mg | ORAL_TABLET | Freq: Four times a day (QID) | ORAL | Status: DC
  Administered 2022-07-22 – 2022-07-26 (×15): 1000 mg via ORAL
  Filled 2022-07-22 (×15): qty 2

## 2022-07-22 MED ORDER — ONDANSETRON 4 MG PO TBDP: 8.0000 mg | ORAL_TABLET | Freq: Three times a day (TID) | ORAL | Status: DC | PRN

## 2022-07-22 MED ORDER — OXYCODONE HCL 5 MG PO TABS
15.0000 mg | ORAL_TABLET | Freq: Four times a day (QID) | ORAL | Status: DC | PRN
  Administered 2022-07-22 – 2022-07-26 (×13): 15 mg via ORAL
  Filled 2022-07-22 (×13): qty 3

## 2022-07-22 MED ORDER — METOPROLOL SUCCINATE ER 50 MG PO TB24
50.0000 mg | ORAL_TABLET | Freq: Every day | ORAL | Status: DC
  Administered 2022-07-22 – 2022-07-26 (×5): 50 mg via ORAL
  Filled 2022-07-22 (×5): qty 1

## 2022-07-22 MED ORDER — AMLODIPINE BESYLATE 10 MG PO TABS
10.0000 mg | ORAL_TABLET | Freq: Every day | ORAL | Status: DC
  Administered 2022-07-22 – 2022-07-26 (×5): 10 mg via ORAL
  Filled 2022-07-22 (×6): qty 1

## 2022-07-22 MED ORDER — POLYETHYLENE GLYCOL 3350 17 G PO PACK
17.0000 g | PACK | Freq: Every day | ORAL | Status: DC | PRN
  Administered 2022-07-23 – 2022-07-24 (×2): 17 g via ORAL
  Filled 2022-07-22 (×2): qty 1

## 2022-07-22 MED ORDER — ONDANSETRON HCL 4 MG/2ML IJ SOLN: 4.0000 mg | Freq: Four times a day (QID) | INTRAMUSCULAR | Status: DC | PRN

## 2022-07-22 MED ORDER — LORATADINE 10 MG PO TABS
10.0000 mg | ORAL_TABLET | Freq: Every day | ORAL | Status: DC
  Administered 2022-07-22 – 2022-07-26 (×5): 10 mg via ORAL
  Filled 2022-07-22 (×5): qty 1

## 2022-07-22 MED ORDER — OXYCODONE HCL 5 MG PO TABS
5.0000 mg | ORAL_TABLET | Freq: Every day | ORAL | Status: DC
  Administered 2022-07-22 – 2022-07-25 (×4): 5 mg via ORAL
  Filled 2022-07-22 (×4): qty 1

## 2022-07-22 MED ORDER — ESTROGENS CONJUGATED 0.45 MG PO TABS
0.4500 mg | ORAL_TABLET | Freq: Every day | ORAL | Status: DC
  Administered 2022-07-23 – 2022-07-26 (×4): 0.45 mg via ORAL
  Filled 2022-07-22 (×5): qty 1

## 2022-07-22 MED ORDER — ARIPIPRAZOLE 5 MG PO TABS
5.0000 mg | ORAL_TABLET | Freq: Every day | ORAL | Status: DC
  Administered 2022-07-22 – 2022-07-26 (×5): 5 mg via ORAL
  Filled 2022-07-22 (×5): qty 1

## 2022-07-22 MED ORDER — ALBUTEROL SULFATE (2.5 MG/3ML) 0.083% IN NEBU: 2.5000 mg | INHALATION_SOLUTION | Freq: Four times a day (QID) | RESPIRATORY_TRACT | Status: DC | PRN

## 2022-07-22 MED ORDER — LOSARTAN POTASSIUM 50 MG PO TABS
100.0000 mg | ORAL_TABLET | Freq: Every day | ORAL | Status: DC
  Administered 2022-07-22 – 2022-07-26 (×5): 100 mg via ORAL
  Filled 2022-07-22 (×5): qty 2

## 2022-07-22 MED ORDER — GABAPENTIN 300 MG PO CAPS
600.0000 mg | ORAL_CAPSULE | Freq: Three times a day (TID) | ORAL | Status: DC
  Administered 2022-07-22 – 2022-07-26 (×14): 600 mg via ORAL
  Filled 2022-07-22 (×9): qty 2
  Filled 2022-07-22 (×2): qty 6
  Filled 2022-07-22 (×3): qty 2

## 2022-07-22 MED ORDER — SPIRONOLACTONE 25 MG PO TABS
25.0000 mg | ORAL_TABLET | Freq: Two times a day (BID) | ORAL | Status: DC
  Administered 2022-07-22 – 2022-07-26 (×9): 25 mg via ORAL
  Filled 2022-07-22 (×10): qty 1

## 2022-07-22 NOTE — Progress Notes (Signed)
2 Days Post-Op  Subjective: Still having having a lot of pain but is ambulating. Passing flatus. No nausea or bloating. No respiratory complaints.  Objective: Vital signs in last 24 hours: Temp:  [97.8 F (36.6 C)-98.2 F (36.8 C)] 98 F (36.7 C) (03/22 0443) Pulse Rate:  [52-72] 72 (03/22 0443) Resp:  [18] 18 (03/22 0443) BP: (131-149)/(67-77) 149/70 (03/22 0443) SpO2:  [95 %-100 %] 100 % (03/22 0443)   Intake/Output from previous day: 03/21 0701 - 03/22 0700 In: 2445.3 [P.O.:60; I.V.:2385.3] Out: 2650 [Urine:2650] Intake/Output this shift: No intake/output data recorded.   General appearance: alert and cooperative Resp: respiratory effort unlabored on room air. Pulls 1000 on IS GI: soft, non-distended, incisions c/d/ with surgical glue. Appropriate TTP over incisions without rebound or guarding. +BS  Lab Results:  Recent Labs    07/19/22 1524 07/20/22 0506  WBC 5.4 11.8*  HGB 13.4 12.1  HCT 41.6 38.0  PLT 278 210    BMET Recent Labs    07/19/22 1524 07/20/22 0506  NA 137 137  K 4.2 3.8  CL 106 107  CO2 25 23  GLUCOSE 120* 126*  BUN 19 22*  CREATININE 0.82 0.86  CALCIUM 8.9 8.7*    PT/INR No results for input(s): "LABPROT", "INR" in the last 72 hours. ABG No results for input(s): "PHART", "HCO3" in the last 72 hours.  Invalid input(s): "PCO2", "PO2"  MEDS, Scheduled  enoxaparin (LOVENOX) injection  40 mg Subcutaneous Q24H   pantoprazole (PROTONIX) IV  40 mg Intravenous QHS    Studies/Results: No results found.  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 Marcello Moores and Dr. Thermon Leyland  - advance to CLD this am and monitor bowel function - home pain regimen ordered (oxycodone, gabapentin). Add scheduled tylenol and robaxin. Dilaudid prn for breakthrough - start daily colace, prn miralax - recheck labs am - encouraged IS use And ambulation  FEN: CLD, dec IVF to 50 ml/hr ID: ancef periop VTE: lovenox  Chronic pain, OA, DDD IBS IDA HTN - multiple home meds ordered this am. Monitor BP LV systolic dysfunction    LOS: 1 day    Winferd Humphrey, [facility] 07/22/2022, 7:53 AM Please see Amion for pager number during day hours 7:00am-4:30pm   07/22/2022 7:44 AM

## 2022-07-22 NOTE — Discharge Instructions (Signed)
[facility]  Please arrive at least 30 min before your appointment to complete your check in paperwork.  If you are unable to arrive 30 min prior to your appointment time we may have to cancel or reschedule you. LAPAROSCOPIC SURGERY: POST OP INSTRUCTIONS Always review your discharge instruction sheet given to you by the facility where your surgery was performed. IF YOU HAVE DISABILITY OR FAMILY LEAVE FORMS, YOU MUST BRING THEM TO THE OFFICE FOR PROCESSING.   DO NOT GIVE THEM TO YOUR DOCTOR.  PAIN CONTROL  First take acetaminophen (Tylenol) AND/or ibuprofen (Advil) to control your pain after surgery.  Follow directions on package.  Taking acetaminophen (Tylenol) and/or ibuprofen (Advil) regularly after surgery will help to control your pain and lower the amount of prescription pain medication you may need.  You should not take more than 4,000 mg (4 grams) of acetaminophen (Tylenol) in 24 hours.  You should not take ibuprofen (Advil), aleve, motrin, naprosyn or other NSAIDS if you have a history of stomach ulcers or chronic kidney disease.  A prescription for pain medication may be given to you upon discharge.  Take your pain medication as prescribed, if you still have uncontrolled pain after taking acetaminophen (Tylenol) or ibuprofen (Advil). Use ice packs to help control pain. If you need a refill on your pain medication, please contact your pharmacy.  They will contact our office to request authorization. Prescriptions will not be filled after 5pm or on week-ends.  HOME MEDICATIONS Take your usually prescribed medications unless otherwise directed.  DIET You should follow a light diet the first few days after arrival home.  Be sure to include lots of fluids daily. Avoid fatty, fried foods.   CONSTIPATION It is common to experience some constipation after surgery and if you are taking pain medication.  Increasing fluid intake and taking a stool softener (such as Colace)  will usually help or prevent this problem from occurring.  A mild laxative (Milk of Magnesia or Miralax) should be taken according to package instructions if there are no bowel movements after 48 hours.  WOUND/INCISION CARE Most patients will experience some swelling and bruising in the area of the incisions.  Ice packs will help.  Swelling and bruising can take several days to resolve.  Unless discharge instructions indicate otherwise, follow guidelines below  STERI-STRIPS - you may remove your outer bandages 48 hours after surgery, and you may shower at that time.  You have steri-strips (small skin tapes) in place directly over the incision.  These strips should be left on the skin for 7-10 days.   DERMABOND/SKIN GLUE - you may shower in 24 hours.  The glue will flake off over the next 2-3 weeks. Any sutures or staples will be removed at the office during your follow-up visit.  ACTIVITIES You may resume regular (light) daily activities beginning the next day--such as daily self-care, walking, climbing stairs--gradually increasing activities as tolerated.  You may have sexual intercourse when it is comfortable.  Refrain from any heavy lifting or straining until approved by your doctor. You may drive when you are no longer taking prescription pain medication, you can comfortably wear a seatbelt, and you can safely maneuver your car and apply brakes.  FOLLOW-UP You should see your doctor in the office for a follow-up appointment approximately 2-3 weeks after your surgery.  You should have been given your post-op/follow-up appointment when your surgery was scheduled.  If you did not receive a post-op/follow-up appointment, make sure   that you call for this appointment within a day or two after you arrive home to insure a convenient appointment time.   WHEN TO CALL YOUR DOCTOR: Fever over 101.0 Inability to urinate Continued bleeding from incision. Increased pain, redness, or drainage from the  incision. Increasing abdominal pain  The clinic staff is available to answer your questions during regular business hours.  Please don't hesitate to call and ask to speak to one of the nurses for clinical concerns.  If you have a medical emergency, go to the nearest emergency room or call 911.  A surgeon from [facility] is always on call at the hospital. [address], [address], [place], [place]  [address] ? [address] [address], ,    [address] [phone] ? [phone] ? FAX [phone]     Managing Your Pain After Surgery Without Opioids    Thank you for participating in our program to help patients manage their pain after surgery without opioids. This is part of our effort to provide you with the best care possible, without exposing you or your family to the risk that opioids pose.  What pain can I expect after surgery? You can expect to have some pain after surgery. This is normal. The pain is typically worse the day after surgery, and quickly begins to get better. Many studies have found that many patients are able to manage their pain after surgery with Over-the-Counter (OTC) medications such as Tylenol and Motrin. If you have a condition that does not allow you to take Tylenol or Motrin, notify your surgical team.  How will I manage my pain? The best strategy for controlling your pain after surgery is around the clock pain control with Tylenol (acetaminophen) and Motrin (ibuprofen or Advil). Alternating these medications with each other allows you to maximize your pain control. In addition to Tylenol and Motrin, you can use heating pads or ice packs on your incisions to help reduce your pain.  How will I alternate your regular strength over-the-counter pain medication? You will take a dose of pain medication every three hours. Start by taking 650 mg of Tylenol (2 pills of 325 mg) 3 hours later take 600 mg of Motrin (3 pills of 200 mg) 3 hours after  taking the Motrin take 650 mg of Tylenol 3 hours after that take 600 mg of Motrin.   - 1 -  See example - if your first dose of Tylenol is at 12:00 PM   12:00 PM Tylenol 650 mg (2 pills of 325 mg)  3:00 PM Motrin 600 mg (3 pills of 200 mg)  6:00 PM Tylenol 650 mg (2 pills of 325 mg)  9:00 PM Motrin 600 mg (3 pills of 200 mg)  Continue alternating every 3 hours   We recommend that you follow this schedule around-the-clock for at least 3 days after surgery, or until you feel that it is no longer needed. Use the table on the last page of this handout to keep track of the medications you are taking. Important: Do not take more than 3000mg of Tylenol or 3200mg of Motrin in a 24-hour period. Do not take ibuprofen/Motrin if you have a history of bleeding stomach ulcers, severe kidney disease, &/or actively taking a blood thinner  What if I still have pain? If you have pain that is not controlled with the over-the-counter pain medications (Tylenol and Motrin or Advil) you might have what we call "breakthrough" pain. You will receive a prescription   for a small amount of an opioid pain medication such as Oxycodone, Tramadol, or Tylenol with Codeine. Use these opioid pills in the first 24 hours after surgery if you have breakthrough pain. Do not take more than 1 pill every 4-6 hours.  If you still have uncontrolled pain after using all opioid pills, don't hesitate to call our staff using the number provided. We will help make sure you are managing your pain in the best way possible, and if necessary, we can provide a prescription for additional pain medication.   Day 1    Time  Name of Medication Number of pills taken  Amount of Acetaminophen  Pain Level   Comments  AM PM       AM PM       AM PM       AM PM       AM PM       AM PM       AM PM       AM PM       Total Daily amount of Acetaminophen Do not take more than  3,000 mg per day      Day 2    Time  Name of Medication  Number of pills taken  Amount of Acetaminophen  Pain Level   Comments  AM PM       AM PM       AM PM       AM PM       AM PM       AM PM       AM PM       AM PM       Total Daily amount of Acetaminophen Do not take more than  3,000 mg per day      Day 3    Time  Name of Medication Number of pills taken  Amount of Acetaminophen  Pain Level   Comments  AM PM       AM PM       AM PM       AM PM         AM PM       AM PM       AM PM       AM PM       Total Daily amount of Acetaminophen Do not take more than  3,000 mg per day      Day 4    Time  Name of Medication Number of pills taken  Amount of Acetaminophen  Pain Level   Comments  AM PM       AM PM       AM PM       AM PM       AM PM       AM PM       AM PM       AM PM       Total Daily amount of Acetaminophen Do not take more than  3,000 mg per day      Day 5    Time  Name of Medication Number of pills taken  Amount of Acetaminophen  Pain Level   Comments  AM PM       AM PM       AM PM       AM PM       AM PM       AM PM         AM PM       AM PM       Total Daily amount of Acetaminophen Do not take more than  3,000 mg per day      Day 6    Time  Name of Medication Number of pills taken  Amount of Acetaminophen  Pain Level  Comments  AM PM       AM PM       AM PM       AM PM       AM PM       AM PM       AM PM       AM PM       Total Daily amount of Acetaminophen Do not take more than  3,000 mg per day      Day 7    Time  Name of Medication Number of pills taken  Amount of Acetaminophen  Pain Level   Comments  AM PM       AM PM       AM PM       AM PM       AM PM       AM PM       AM PM       AM PM       Total Daily amount of Acetaminophen Do not take more than  3,000 mg per day        For additional information about how and where to safely dispose of unused opioid medications - https://www.morepowerfulnc.org  Disclaimer: This document contains  information and/or instructional materials adapted from [facility] for the typical patient with your condition. It does not replace medical advice from your health care provider because your experience may differ from that of the typical patient. Talk to your health care provider if you have any questions about this document, your condition or your treatment plan. Adapted from [facility]  

## 2022-07-23 LAB — CBC
HCT: 36.7 % (ref 36.0–46.0)
Hemoglobin: 11.9 g/dL — ABNORMAL LOW (ref 12.0–15.0)
MCH: 28.5 pg (ref 26.0–34.0)
MCHC: 32.4 g/dL (ref 30.0–36.0)
MCV: 87.8 fL (ref 80.0–100.0)
Platelets: 210 10*3/uL (ref 150–400)
RBC: 4.18 MIL/uL (ref 3.87–5.11)
RDW: 14 % (ref 11.5–15.5)
WBC: 3.5 10*3/uL — ABNORMAL LOW (ref 4.0–10.5)
nRBC: 0 % (ref 0.0–0.2)

## 2022-07-23 LAB — BASIC METABOLIC PANEL
Anion gap: 5 (ref 5–15)
BUN: 9 mg/dL (ref 6–20)
CO2: 29 mmol/L (ref 22–32)
Calcium: 8.5 mg/dL — ABNORMAL LOW (ref 8.9–10.3)
Chloride: 103 mmol/L (ref 98–111)
Creatinine, Ser: 0.77 mg/dL (ref 0.44–1.00)
GFR, Estimated: >60 mL/min (ref >60)
Glucose, Bld: 105 mg/dL — ABNORMAL HIGH (ref 70–99)
Potassium: 3.8 mmol/L (ref 3.5–5.1)
Sodium: 137 mmol/L (ref 135–145)

## 2022-07-23 MED ORDER — PANTOPRAZOLE SODIUM 40 MG PO TBEC
40.0000 mg | DELAYED_RELEASE_TABLET | Freq: Every day | ORAL | Status: DC
  Administered 2022-07-23 – 2022-07-25 (×3): 40 mg via ORAL
  Filled 2022-07-23 (×3): qty 1

## 2022-07-23 NOTE — Progress Notes (Signed)
3 Days Post-Op   Subjective/Chief Complaint: Other than post op mild pain, tolerating po with no nausea or bloating No flatus yet   Objective: Vital signs in last 24 hours: Temp:  [97.4 F (36.3 C)-97.6 F (36.4 C)] 97.4 F (36.3 C) (03/23 0534) Pulse Rate:  [40-54] 54 (03/23 0534) Resp:  [17-18] 18 (03/23 0534) BP: (127-142)/(72-81) 129/74 (03/23 0534) SpO2:  [93 %-99 %] 99 % (03/23 0534) Last BM Date : 07/18/22  Intake/Output from previous day: 03/22 0701 - 03/23 0700 In: 1500.2 [P.O.:120; I.V.:1380.2] Out: 700 [Urine:700] Intake/Output this shift: No intake/output data recorded.  Exam: Awake and alert  Looks comfortable Abdomen soft, minimally tender, no distension  Lab Results:  Recent Labs    07/23/22 0452  WBC 3.5*  HGB 11.9*  HCT 36.7  PLT 210   BMET Recent Labs    07/23/22 0452  NA 137  K 3.8  CL 103  CO2 29  GLUCOSE 105*  BUN 9  CREATININE 0.77  CALCIUM 8.5*   PT/INR No results for input(s): "LABPROT", "INR" in the last 72 hours. ABG No results for input(s): "PHART", "HCO3" in the last 72 hours.  Invalid input(s): "PCO2", "PO2"  Studies/Results: No results found.  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 Marcello Moores and Dr. Thermon Leyland  Continues to improve Will advance to full liquids Decrease IVF  [facility]ccormick 07/23/2022

## 2022-07-24 MED ORDER — BISACODYL 10 MG RE SUPP
10.0000 mg | Freq: Once | RECTAL | Status: AC
  Administered 2022-07-24: 10 mg via RECTAL
  Filled 2022-07-24: qty 1

## 2022-07-24 NOTE — Progress Notes (Signed)
4 Days Post-Op   Subjective/Chief Complaint: Passing flatus but no bm Still with intermittent cramping abdominal pain   Objective: Vital signs in last 24 hours: Temp:  [97.5 F (36.4 C)-97.7 F (36.5 C)] 97.5 F (36.4 C) (03/24 0530) Pulse Rate:  [56-97] 91 (03/24 0530) Resp:  [14-18] 18 (03/24 0530) BP: (92-137)/(53-84) 137/75 (03/24 0530) SpO2:  [93 %-100 %] 94 % (03/24 0530) Last BM Date : 07/18/22 (Miralax given)  Intake/Output from previous day: 03/23 0701 - 03/24 0700 In: 659.9 [P.O.:480; I.V.:179.9] Out: 700 [Urine:700] Intake/Output this shift: No intake/output data recorded.  Exam: Awake and alert Abdomen soft, non-distended, minimally tender Lab Results:  Recent Labs    07/23/22 0452  WBC 3.5*  HGB 11.9*  HCT 36.7  PLT 210   BMET Recent Labs    07/23/22 0452  NA 137  K 3.8  CL 103  CO2 29  GLUCOSE 105*  BUN 9  CREATININE 0.77  CALCIUM 8.5*   PT/INR No results for input(s): "LABPROT", "INR" in the last 72 hours. ABG No results for input(s): "PHART", "HCO3" in the last 72 hours.  Invalid input(s): "PCO2", "PO2"  Studies/Results: No results found.  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 Marcello Moores and Dr. Thermon Leyland  Post op ileus Will try suppository today Continue full liquids Repeat labs in the morning  Coralie Keens MD 07/24/2022

## 2022-07-25 LAB — BASIC METABOLIC PANEL
Anion gap: 9 (ref 5–15)
BUN: 12 mg/dL (ref 6–20)
CO2: 27 mmol/L (ref 22–32)
Calcium: 8.2 mg/dL — ABNORMAL LOW (ref 8.9–10.3)
Chloride: 102 mmol/L (ref 98–111)
Creatinine, Ser: 0.69 mg/dL (ref 0.44–1.00)
GFR, Estimated: >60 mL/min (ref >60)
Glucose, Bld: 84 mg/dL (ref 70–99)
Potassium: 3.8 mmol/L (ref 3.5–5.1)
Sodium: 138 mmol/L (ref 135–145)

## 2022-07-25 LAB — CBC
HCT: 36.3 % (ref 36.0–46.0)
Hemoglobin: 11.9 g/dL — ABNORMAL LOW (ref 12.0–15.0)
MCH: 28.8 pg (ref 26.0–34.0)
MCHC: 32.8 g/dL (ref 30.0–36.0)
MCV: 87.9 fL (ref 80.0–100.0)
Platelets: 239 10*3/uL (ref 150–400)
RBC: 4.13 MIL/uL (ref 3.87–5.11)
RDW: 14.6 % (ref 11.5–15.5)
WBC: 4.4 10*3/uL (ref 4.0–10.5)
nRBC: 0 % (ref 0.0–0.2)

## 2022-07-25 MED ORDER — POLYETHYLENE GLYCOL 3350 17 G PO PACK
17.0000 g | PACK | Freq: Two times a day (BID) | ORAL | Status: DC
  Administered 2022-07-25 – 2022-07-26 (×3): 17 g via ORAL
  Filled 2022-07-25 (×3): qty 1

## 2022-07-25 MED ORDER — LIDOCAINE 5 % EX PTCH
1.0000 | MEDICATED_PATCH | CUTANEOUS | Status: DC
  Administered 2022-07-25 – 2022-07-26 (×2): 1 via TRANSDERMAL
  Filled 2022-07-25 (×2): qty 1

## 2022-07-25 NOTE — Progress Notes (Signed)
Progress Note  5 Days Post-Op  Subjective: Pt reports she is passing flatus and slightly more than she was yesterday. Small BM with suppository but no other BMs. Pain control improving but still needing some additional IV pain meds. Denies nausea or vomiting. Feels a little bloated in upper abdomen but not severe.   Objective: Vital signs in last 24 hours: Temp:  [97.2 F (36.2 C)-97.6 F (36.4 C)] 97.4 F (36.3 C) (03/25 0549) Pulse Rate:  [50-87] 59 (03/25 0549) Resp:  [14-16] 16 (03/25 0549) BP: (115-142)/(69-82) 142/82 (03/25 0549) SpO2:  [86 %-100 %] 100 % (03/25 0549) Last BM Date : 07/18/22  Intake/Output from previous day: 03/24 0701 - 03/25 0700 In: 1395.5 [P.O.:1280; I.V.:115.5] Out: 2100 [Urine:2100] Intake/Output this shift: No intake/output data recorded.  PE: General: pleasant, WD, WN female who is laying in bed in NAD Heart: regular, rate, and rhythm.   Lungs: Respiratory effort nonlabored Abd: soft, appropriately ttp, mild distention, incisions C/D/I Psych: A&Ox3 with an appropriate affect.    Lab Results:  Recent Labs    07/23/22 0452 07/25/22 0422  WBC 3.5* 4.4  HGB 11.9* 11.9*  HCT 36.7 36.3  PLT 210 239   BMET Recent Labs    07/23/22 0452 07/25/22 0422  NA 137 138  K 3.8 3.8  CL 103 102  CO2 29 27  GLUCOSE 105* 84  BUN 9 12  CREATININE 0.77 0.69  CALCIUM 8.5* 8.2*   PT/INR No results for input(s): "LABPROT", "INR" in the last 72 hours. CMP     Component Value Date/Time   NA 138 07/25/2022 0422   NA 135 (L) 09/08/2013 0727   K 3.8 07/25/2022 0422   K 4.0 09/08/2013 0727   CL 102 07/25/2022 0422   CL 105 09/08/2013 0727   CO2 27 07/25/2022 0422   CO2 25 09/08/2013 0727   GLUCOSE 84 07/25/2022 0422   GLUCOSE 96 09/08/2013 0727   BUN 12 07/25/2022 0422   BUN 19 (H) 09/08/2013 0727   CREATININE 0.69 07/25/2022 0422   CREATININE 0.98 09/08/2013 0727   CALCIUM 8.2 (L) 07/25/2022 0422   CALCIUM 8.6 09/08/2013 0727   PROT  7.2 07/19/2022 1524   PROT 7.8 09/08/2013 0727   ALBUMIN 3.7 07/19/2022 1524   ALBUMIN 3.5 09/08/2013 0727   AST 24 07/19/2022 1524   AST 28 09/08/2013 0727   ALT 20 07/19/2022 1524   ALT 20 09/08/2013 0727   ALKPHOS 109 07/19/2022 1524   ALKPHOS 75 09/08/2013 0727   BILITOT 0.7 07/19/2022 1524   BILITOT 0.1 (L) 09/08/2013 0727   GFRNONAA >60 07/25/2022 0422   GFRNONAA >60 09/08/2013 0727   GFRAA >60 01/16/2018 2027   GFRAA >60 09/08/2013 0727   Lipase     Component Value Date/Time   LIPASE 33 07/19/2022 1524   LIPASE 205 02/11/2013 1159       Studies/Results: No results found.  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 Marcello Moores and Dr. Thermon Leyland - ileus improving and passing more flatus - ok to advance to soft diet this AM - increase miralax from daily prn to BID - encouraged ambulation  - try lidocaine patch to help pain control   FEN:  soft diet, KVO VTE: LMWH ID: no current abx   LOS: 4 days    Norm Parcel, [facility] 07/25/2022, 8:25 AM Please see Amion for pager number during day hours 7:00am-4:30pm

## 2022-07-26 MED ORDER — METOPROLOL SUCCINATE ER 50 MG PO TB24: 50.0000 mg | ORAL_TABLET | Freq: Every day | ORAL | 11 refills | Status: DC

## 2022-07-26 MED ORDER — SORBITOL 70 % SOLN: 960.0000 mL | TOPICAL_OIL | Freq: Once | ORAL | Status: DC | PRN

## 2022-07-26 MED ORDER — DOCUSATE SODIUM 100 MG PO CAPS: 100.0000 mg | ORAL_CAPSULE | Freq: Every day | ORAL | 0 refills | Status: AC

## 2022-07-26 MED ORDER — BISACODYL 10 MG RE SUPP
10.0000 mg | Freq: Once | RECTAL | Status: AC
  Administered 2022-07-26: 10 mg via RECTAL
  Filled 2022-07-26: qty 1

## 2022-07-26 MED ORDER — PANTOPRAZOLE SODIUM 40 MG PO TBEC: 40.0000 mg | DELAYED_RELEASE_TABLET | Freq: Every day | ORAL | 0 refills | Status: AC

## 2022-07-26 MED ORDER — POLYETHYLENE GLYCOL 3350 17 G PO PACK: 17.0000 g | PACK | Freq: Two times a day (BID) | ORAL | 0 refills | Status: AC

## 2022-07-26 MED ORDER — MAGNESIUM CITRATE PO SOLN
0.5000 | Freq: Once | ORAL | Status: AC
  Administered 2022-07-26: 0.5 via ORAL
  Filled 2022-07-26: qty 296

## 2022-07-26 MED ORDER — ACETAMINOPHEN 500 MG PO TABS: 1000.0000 mg | ORAL_TABLET | Freq: Four times a day (QID) | ORAL | 0 refills | Status: AC

## 2022-07-26 MED ORDER — OXYCODONE HCL 5 MG PO TABS: 5.0000 mg | ORAL_TABLET | Freq: Three times a day (TID) | ORAL | 0 refills | Status: AC | PRN

## 2022-07-26 MED ORDER — METHOCARBAMOL 750 MG PO TABS: 750.0000 mg | ORAL_TABLET | Freq: Three times a day (TID) | ORAL | 0 refills | Status: AC | PRN

## 2022-07-26 NOTE — Progress Notes (Signed)
Progress Note  6 Days Post-Op  Subjective: Tolerating soft diet but not eating a ton. +flatus. No BM yesterday, no substantial BM since surgery. Reports having a lot of pain - both chronic and some soreness from the operation but has been walking a lot.   Requesting for a second opinion from a different cardiologist   Objective: Vital signs in last 24 hours: Temp:  [97.7 F (36.5 C)-98.1 F (36.7 C)] 97.7 F (36.5 C) (03/26 0635) Pulse Rate:  [46-70] 63 (03/26 0635) Resp:  [14-18] 18 (03/26 0635) BP: (97-129)/(55-74) 117/55 (03/26 0635) SpO2:  [97 %-100 %] 100 % (03/26 0635) Last BM Date : 07/18/22  Intake/Output from previous day: 03/25 0701 - 03/26 0700 In: 1569.8 [P.O.:1320; I.V.:249.8] Out: 1550 [Urine:1550] Intake/Output this shift: No intake/output data recorded.  PE: General: pleasant, WD, WN female who is laying in bed in NAD Heart: regular, rate, and rhythm.   Lungs: Respiratory effort nonlabored Abd: soft, appropriately ttp, non-distended , incisions C/D/I Psych: A&Ox3 with an appropriate affect.    Lab Results:  Recent Labs    07/25/22 0422  WBC 4.4  HGB 11.9*  HCT 36.3  PLT 239   BMET Recent Labs    07/25/22 0422  NA 138  K 3.8  CL 102  CO2 27  GLUCOSE 84  BUN 12  CREATININE 0.69  CALCIUM 8.2*   PT/INR No results for input(s): "LABPROT", "INR" in the last 72 hours. CMP     Component Value Date/Time   NA 138 07/25/2022 0422   NA 135 (L) 09/08/2013 0727   K 3.8 07/25/2022 0422   K 4.0 09/08/2013 0727   CL 102 07/25/2022 0422   CL 105 09/08/2013 0727   CO2 27 07/25/2022 0422   CO2 25 09/08/2013 0727   GLUCOSE 84 07/25/2022 0422   GLUCOSE 96 09/08/2013 0727   BUN 12 07/25/2022 0422   BUN 19 (H) 09/08/2013 0727   CREATININE 0.69 07/25/2022 0422   CREATININE 0.98 09/08/2013 0727   CALCIUM 8.2 (L) 07/25/2022 0422   CALCIUM 8.6 09/08/2013 0727   PROT 7.2 07/19/2022 1524   PROT 7.8 09/08/2013 0727   ALBUMIN 3.7 07/19/2022 1524    ALBUMIN 3.5 09/08/2013 0727   AST 24 07/19/2022 1524   AST 28 09/08/2013 0727   ALT 20 07/19/2022 1524   ALT 20 09/08/2013 0727   ALKPHOS 109 07/19/2022 1524   ALKPHOS 75 09/08/2013 0727   BILITOT 0.7 07/19/2022 1524   BILITOT 0.1 (L) 09/08/2013 0727   GFRNONAA >60 07/25/2022 0422   GFRNONAA >60 09/08/2013 0727   GFRAA >60 01/16/2018 2027   GFRAA >60 09/08/2013 0727   Lipase     Component Value Date/Time   LIPASE 33 07/19/2022 1524   LIPASE 205 02/11/2013 1159       Studies/Results: No results found.  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 Marcello Moores and Dr. Thermon Leyland - ileus improving,. I suspect some of this is constipation. - continue soft diet this AM - given BID miralax yesterday, give 0.5 bottle mag citrate now.  - encouraged ambulation  - anticipate afternoon discharge if has a BM after mag citrate  FEN: soft diet, KVO VTE: LMWH ID: no  current abx   LOS: 5 days    [facility] 07/26/2022, 9:32 AM Please see Amion for pager number during day hours 7:00am-4:30pm

## 2022-07-26 NOTE — Progress Notes (Signed)
Discharge instructions given to patient and all questions were answered.  

## 2022-08-05 DIAGNOSIS — I10 Essential (primary) hypertension: Secondary | ICD-10-CM | POA: Diagnosis not present

## 2022-08-05 DIAGNOSIS — Z6824 Body mass index (BMI) 24.0-24.9, adult: Secondary | ICD-10-CM | POA: Diagnosis not present

## 2022-08-05 DIAGNOSIS — J029 Acute pharyngitis, unspecified: Secondary | ICD-10-CM | POA: Diagnosis not present

## 2022-08-05 DIAGNOSIS — J069 Acute upper respiratory infection, unspecified: Secondary | ICD-10-CM | POA: Diagnosis not present

## 2022-08-05 DIAGNOSIS — R051 Acute cough: Secondary | ICD-10-CM | POA: Diagnosis not present

## 2022-08-05 NOTE — Discharge Summary (Signed)
[facility] Discharge Summary   Patient ID: Paula CooperRebecca Mccormick MRN: [id number] DOB/AGE: 51/08/1970 51 y.o.  Admit date: 07/19/2022 Discharge date: 07/26/2022  Admitting Diagnosis: SBO  Discharge Diagnosis Patient Active Problem List   Diagnosis Date Noted   Internal hernia 07/21/2022   SBO (small bowel obstruction) 07/19/2022   AKI (acute kidney injury) 12/29/2015   Chest pain with moderate risk for cardiac etiology 12/29/2015   Essential hypertension    Acute renal failure    Dehydration    SOB (shortness of breath)     Consultants None   Imaging: No results found.  Procedures Dr. Romie LeveeAlicia Thomas 07/20/22 - diagnostic laparoscopy, reduction of internal hernia, LOA, closure of mesenteric defect   Hospital Course:  52 y/o F with hisotry of antecolic gastric bypass at [facility] in [facility] who presented to with acute abdominal pain.  Workup showed SBO with possible roux limb obstruction.  Patient was admitted and underwent procedure listed above where an internal hernia was found.  Tolerated procedure well and was transferred to the floor.  Diet was advanced as tolerated.  On POD#6, the patient was voiding well, tolerating diet, ambulating well, pain well controlled, vital signs stable, incisions c/d/i and felt stable for discharge home.  Patient will follow up as below and knows to call with questions or concerns.    I have personally reviewed the patients medication history on the  controlled substance database.    Allergies as of 07/26/2022       Reactions   Hydrochlorothiazide Other (See Comments)   Severe dehydration Severe dehydration   Lisinopril Other (See Comments)   Other reaction(s): Other (See Comments)   Morphine And Related Hives   Other Hives   Morphine Itching, Rash        Medication List     TAKE these medications    acetaminophen 500 MG tablet Commonly known as: TYLENOL Take 2 tablets (1,000 mg total) by mouth every 6 (six) hours.    albuterol 108 (90 Base) MCG/ACT inhaler Commonly known as: VENTOLIN HFA Inhale 2 puffs into the lungs every 6 (six) hours as needed for wheezing or shortness of breath.   amLODipine 10 MG tablet Commonly known as: NORVASC Take 10 mg by mouth daily.   amphetamine-dextroamphetamine 15 MG tablet Commonly known as: ADDERALL Take 1 tablet by mouth 2 (two) times daily.   ARIPiprazole 5 MG tablet Commonly known as: ABILIFY Take 5 mg by mouth daily.   docusate sodium 100 MG capsule Commonly known as: COLACE Take 1 capsule (100 mg total) by mouth daily.   DULoxetine 60 MG capsule Commonly known as: CYMBALTA Take 60 mg by mouth daily.   estrogens (conjugated) 0.45 MG tablet Commonly known as: PREMARIN Take 0.45 mg by mouth daily.   gabapentin 600 MG tablet Commonly known as: NEURONTIN Take 600 mg by mouth 3 (three) times daily.   loratadine 10 MG tablet Commonly known as: CLARITIN Take 10 mg by mouth daily.   losartan 100 MG tablet Commonly known as: COZAAR Take 1 tablet by mouth daily.   methocarbamol 750 MG tablet Commonly known as: ROBAXIN Take 1 tablet (750 mg total) by mouth every 8 (eight) hours as needed for muscle spasms (post-op pain).   metoprolol succinate 25 MG 24 hr tablet Commonly known as: TOPROL-XL Take 1 tablet (25 mg total) by mouth daily. What changed: Another medication with the same name was removed. Continue taking this medication, and follow the directions you see here.   ondansetron 8  MG tablet Commonly known as: ZOFRAN Take 8 mg by mouth every 8 (eight) hours as needed for vomiting or nausea.   oxyCODONE 15 MG immediate release tablet Commonly known as: ROXICODONE Take 15 mg by mouth 3 (three) times daily as needed for pain. What changed: Another medication with the same name was removed. Continue taking this medication, and follow the directions you see here.   pantoprazole 40 MG tablet Commonly known as: PROTONIX Take 1 tablet (40 mg  total) by mouth at bedtime.   polyethylene glycol 17 g packet Commonly known as: MIRALAX / GLYCOLAX Take 17 g by mouth 2 (two) times daily.   spironolactone 25 MG tablet Commonly known as: ALDACTONE Take 25 mg by mouth 2 (two) times daily.       ASK your doctor about these medications    oxyCODONE 5 MG immediate release tablet Commonly known as: Roxicodone Take 1 tablet (5 mg total) by mouth every 8 (eight) hours as needed for up to 7 days for severe pain or breakthrough pain (not relieved by home oxy 15 mg). Ask about: Should I take this medication?          Follow-up Information     Stechschulte, Hyman Hopes, MD Follow up on 08/18/2022.   Specialty: Surgery Why: surgical follow up 08/18/22 at 11 am. Please arrive 30 minutes early to complete check in, and bring photo ID and insurance card. Contact information: [address] [place] [address] [phone]                 Signed: Hosie Spangle, [facility] 08/05/2022, 1:30 PM

## 2022-08-24 DIAGNOSIS — H5213 Myopia, bilateral: Secondary | ICD-10-CM | POA: Diagnosis not present

## 2022-09-29 DIAGNOSIS — R8290 Unspecified abnormal findings in urine: Secondary | ICD-10-CM | POA: Diagnosis not present

## 2022-09-29 DIAGNOSIS — D508 Other iron deficiency anemias: Secondary | ICD-10-CM | POA: Diagnosis not present

## 2022-09-29 DIAGNOSIS — N951 Menopausal and female climacteric states: Secondary | ICD-10-CM | POA: Diagnosis not present

## 2022-09-29 DIAGNOSIS — G894 Chronic pain syndrome: Secondary | ICD-10-CM | POA: Diagnosis not present

## 2023-01-16 DIAGNOSIS — Z79899 Other long term (current) drug therapy: Secondary | ICD-10-CM | POA: Diagnosis not present

## 2023-01-16 DIAGNOSIS — G894 Chronic pain syndrome: Secondary | ICD-10-CM | POA: Diagnosis not present

## 2023-01-16 DIAGNOSIS — Z23 Encounter for immunization: Secondary | ICD-10-CM | POA: Diagnosis not present

## 2023-01-16 DIAGNOSIS — N951 Menopausal and female climacteric states: Secondary | ICD-10-CM | POA: Diagnosis not present

## 2023-01-16 DIAGNOSIS — R8290 Unspecified abnormal findings in urine: Secondary | ICD-10-CM | POA: Diagnosis not present

## 2023-01-16 DIAGNOSIS — D508 Other iron deficiency anemias: Secondary | ICD-10-CM | POA: Diagnosis not present

## 2023-01-16 DIAGNOSIS — M461 Sacroiliitis, not elsewhere classified: Secondary | ICD-10-CM | POA: Diagnosis not present

## 2023-01-16 DIAGNOSIS — F321 Major depressive disorder, single episode, moderate: Secondary | ICD-10-CM | POA: Diagnosis not present

## 2023-01-20 DIAGNOSIS — F900 Attention-deficit hyperactivity disorder, predominantly inattentive type: Secondary | ICD-10-CM | POA: Diagnosis not present

## 2023-01-20 DIAGNOSIS — F339 Major depressive disorder, recurrent, unspecified: Secondary | ICD-10-CM | POA: Diagnosis not present

## 2023-02-21 DIAGNOSIS — R1310 Dysphagia, unspecified: Secondary | ICD-10-CM | POA: Diagnosis not present

## 2023-03-20 DIAGNOSIS — Z1211 Encounter for screening for malignant neoplasm of colon: Secondary | ICD-10-CM | POA: Diagnosis not present

## 2023-03-20 DIAGNOSIS — Z23 Encounter for immunization: Secondary | ICD-10-CM | POA: Diagnosis not present

## 2023-03-20 DIAGNOSIS — G894 Chronic pain syndrome: Secondary | ICD-10-CM | POA: Diagnosis not present

## 2023-03-20 DIAGNOSIS — I519 Heart disease, unspecified: Secondary | ICD-10-CM | POA: Diagnosis not present

## 2023-03-20 DIAGNOSIS — Z1239 Encounter for other screening for malignant neoplasm of breast: Secondary | ICD-10-CM | POA: Diagnosis not present

## 2023-03-20 DIAGNOSIS — D508 Other iron deficiency anemias: Secondary | ICD-10-CM | POA: Diagnosis not present

## 2023-03-20 DIAGNOSIS — K1379 Other lesions of oral mucosa: Secondary | ICD-10-CM | POA: Diagnosis not present

## 2023-03-20 DIAGNOSIS — I10 Essential (primary) hypertension: Secondary | ICD-10-CM | POA: Diagnosis not present

## 2023-03-20 DIAGNOSIS — R519 Headache, unspecified: Secondary | ICD-10-CM | POA: Diagnosis not present

## 2023-03-20 DIAGNOSIS — Z0000 Encounter for general adult medical examination without abnormal findings: Secondary | ICD-10-CM | POA: Diagnosis not present

## 2023-04-10 DIAGNOSIS — G894 Chronic pain syndrome: Secondary | ICD-10-CM | POA: Diagnosis not present

## 2023-04-10 DIAGNOSIS — K5903 Drug induced constipation: Secondary | ICD-10-CM | POA: Diagnosis not present

## 2023-04-10 DIAGNOSIS — K6389 Other specified diseases of intestine: Secondary | ICD-10-CM | POA: Diagnosis not present

## 2023-04-10 DIAGNOSIS — T402X5A Adverse effect of other opioids, initial encounter: Secondary | ICD-10-CM | POA: Diagnosis not present

## 2023-06-27 DIAGNOSIS — R928 Other abnormal and inconclusive findings on diagnostic imaging of breast: Secondary | ICD-10-CM | POA: Diagnosis not present

## 2023-07-06 DIAGNOSIS — I10 Essential (primary) hypertension: Secondary | ICD-10-CM | POA: Diagnosis not present

## 2023-07-06 DIAGNOSIS — Z79899 Other long term (current) drug therapy: Secondary | ICD-10-CM | POA: Diagnosis not present

## 2023-07-06 DIAGNOSIS — Z1211 Encounter for screening for malignant neoplasm of colon: Secondary | ICD-10-CM | POA: Diagnosis not present

## 2023-07-06 DIAGNOSIS — G894 Chronic pain syndrome: Secondary | ICD-10-CM | POA: Diagnosis not present

## 2023-10-06 DIAGNOSIS — F900 Attention-deficit hyperactivity disorder, predominantly inattentive type: Secondary | ICD-10-CM | POA: Diagnosis not present

## 2023-10-06 DIAGNOSIS — F419 Anxiety disorder, unspecified: Secondary | ICD-10-CM | POA: Diagnosis not present

## 2023-10-06 DIAGNOSIS — F339 Major depressive disorder, recurrent, unspecified: Secondary | ICD-10-CM | POA: Diagnosis not present

## 2023-10-16 DIAGNOSIS — M25551 Pain in right hip: Secondary | ICD-10-CM | POA: Diagnosis not present

## 2023-10-16 DIAGNOSIS — R635 Abnormal weight gain: Secondary | ICD-10-CM | POA: Diagnosis not present

## 2023-10-16 DIAGNOSIS — M1611 Unilateral primary osteoarthritis, right hip: Secondary | ICD-10-CM | POA: Diagnosis not present

## 2023-10-16 DIAGNOSIS — Z79899 Other long term (current) drug therapy: Secondary | ICD-10-CM | POA: Diagnosis not present

## 2023-10-16 DIAGNOSIS — G894 Chronic pain syndrome: Secondary | ICD-10-CM | POA: Diagnosis not present

## 2023-11-29 DIAGNOSIS — F339 Major depressive disorder, recurrent, unspecified: Secondary | ICD-10-CM | POA: Diagnosis not present

## 2023-11-29 DIAGNOSIS — F900 Attention-deficit hyperactivity disorder, predominantly inattentive type: Secondary | ICD-10-CM | POA: Diagnosis not present

## 2024-01-18 DIAGNOSIS — I10 Essential (primary) hypertension: Secondary | ICD-10-CM | POA: Diagnosis not present

## 2024-01-18 DIAGNOSIS — D649 Anemia, unspecified: Secondary | ICD-10-CM | POA: Diagnosis not present

## 2024-01-18 DIAGNOSIS — Z23 Encounter for immunization: Secondary | ICD-10-CM | POA: Diagnosis not present

## 2024-01-18 DIAGNOSIS — G4700 Insomnia, unspecified: Secondary | ICD-10-CM | POA: Diagnosis not present

## 2024-01-18 DIAGNOSIS — M25551 Pain in right hip: Secondary | ICD-10-CM | POA: Diagnosis not present

## 2024-01-18 DIAGNOSIS — R0600 Dyspnea, unspecified: Secondary | ICD-10-CM | POA: Diagnosis not present

## 2024-01-18 DIAGNOSIS — Z79899 Other long term (current) drug therapy: Secondary | ICD-10-CM | POA: Diagnosis not present

## 2024-01-18 DIAGNOSIS — G894 Chronic pain syndrome: Secondary | ICD-10-CM | POA: Diagnosis not present

## 2024-01-24 DIAGNOSIS — F339 Major depressive disorder, recurrent, unspecified: Secondary | ICD-10-CM | POA: Diagnosis not present

## 2024-01-24 DIAGNOSIS — F419 Anxiety disorder, unspecified: Secondary | ICD-10-CM | POA: Diagnosis not present

## 2024-01-24 DIAGNOSIS — F900 Attention-deficit hyperactivity disorder, predominantly inattentive type: Secondary | ICD-10-CM | POA: Diagnosis not present

## 2024-02-08 DIAGNOSIS — G4700 Insomnia, unspecified: Secondary | ICD-10-CM | POA: Diagnosis not present

## 2024-02-08 DIAGNOSIS — I10 Essential (primary) hypertension: Secondary | ICD-10-CM | POA: Diagnosis not present

## 2024-02-08 DIAGNOSIS — M461 Sacroiliitis, not elsewhere classified: Secondary | ICD-10-CM | POA: Diagnosis not present

## 2024-02-15 DIAGNOSIS — I519 Heart disease, unspecified: Secondary | ICD-10-CM | POA: Diagnosis not present

## 2024-02-15 DIAGNOSIS — D509 Iron deficiency anemia, unspecified: Secondary | ICD-10-CM | POA: Diagnosis not present

## 2024-02-15 DIAGNOSIS — I10 Essential (primary) hypertension: Secondary | ICD-10-CM | POA: Diagnosis not present

## 2024-02-22 DIAGNOSIS — F900 Attention-deficit hyperactivity disorder, predominantly inattentive type: Secondary | ICD-10-CM | POA: Diagnosis not present

## 2024-02-22 DIAGNOSIS — F419 Anxiety disorder, unspecified: Secondary | ICD-10-CM | POA: Diagnosis not present

## 2024-02-22 DIAGNOSIS — F331 Major depressive disorder, recurrent, moderate: Secondary | ICD-10-CM | POA: Diagnosis not present

## 2024-02-29 DIAGNOSIS — I5020 Unspecified systolic (congestive) heart failure: Secondary | ICD-10-CM | POA: Diagnosis not present
